# Patient Record
Sex: Female | Born: 1970 | Race: White | Hispanic: No | Smoking: Former smoker
Health system: Southern US, Community
[De-identification: ages and names within clinical notes are randomized; demographics above are authoritative.]

## PROBLEM LIST (undated history)

## (undated) DIAGNOSIS — F32A Depression, unspecified: Secondary | ICD-10-CM

## (undated) DIAGNOSIS — F419 Anxiety disorder, unspecified: Secondary | ICD-10-CM

## (undated) DIAGNOSIS — F329 Major depressive disorder, single episode, unspecified: Secondary | ICD-10-CM

## (undated) HISTORY — DX: Depression, unspecified: F32.A

## (undated) HISTORY — DX: Major depressive disorder, single episode, unspecified: F32.9

## (undated) HISTORY — DX: Anxiety disorder, unspecified: F41.9

## (undated) HISTORY — PX: LYMPHANGIOMA EXCISION: SHX1990

---

## 2002-01-01 ENCOUNTER — Emergency Department (HOSPITAL_COMMUNITY): Admission: EM | Admit: 2002-01-01 | Discharge: 2002-01-01 | Payer: Self-pay | Admitting: Emergency Medicine

## 2002-01-01 ENCOUNTER — Encounter: Payer: Self-pay | Admitting: Emergency Medicine

## 2002-11-10 ENCOUNTER — Other Ambulatory Visit: Admission: RE | Admit: 2002-11-10 | Discharge: 2002-11-10 | Payer: Self-pay | Admitting: Family Medicine

## 2002-11-12 ENCOUNTER — Encounter: Payer: Self-pay | Admitting: Family Medicine

## 2002-11-12 ENCOUNTER — Encounter: Admission: RE | Admit: 2002-11-12 | Discharge: 2002-11-12 | Payer: Self-pay | Admitting: Family Medicine

## 2003-08-07 HISTORY — PX: PARTIAL COLECTOMY: SHX5273

## 2003-12-06 ENCOUNTER — Ambulatory Visit (HOSPITAL_COMMUNITY): Admission: RE | Admit: 2003-12-06 | Discharge: 2003-12-06 | Payer: Self-pay | Admitting: Obstetrics and Gynecology

## 2003-12-13 ENCOUNTER — Other Ambulatory Visit: Admission: RE | Admit: 2003-12-13 | Discharge: 2003-12-13 | Payer: Self-pay | Admitting: Obstetrics and Gynecology

## 2004-01-15 ENCOUNTER — Inpatient Hospital Stay (HOSPITAL_COMMUNITY): Admission: AD | Admit: 2004-01-15 | Discharge: 2004-01-27 | Payer: Self-pay | Admitting: Obstetrics & Gynecology

## 2004-01-18 ENCOUNTER — Encounter (INDEPENDENT_AMBULATORY_CARE_PROVIDER_SITE_OTHER): Payer: Self-pay | Admitting: Specialist

## 2004-02-15 ENCOUNTER — Inpatient Hospital Stay (HOSPITAL_COMMUNITY): Admission: AD | Admit: 2004-02-15 | Discharge: 2004-02-20 | Payer: Self-pay | Admitting: Obstetrics and Gynecology

## 2004-02-15 ENCOUNTER — Encounter (INDEPENDENT_AMBULATORY_CARE_PROVIDER_SITE_OTHER): Payer: Self-pay | Admitting: *Deleted

## 2004-02-16 ENCOUNTER — Encounter: Payer: Self-pay | Admitting: Obstetrics & Gynecology

## 2004-08-12 IMAGING — CT CT ABDOMEN W/ CM
1 of 2 series · 15 of 32 positions shown, 19 images · IV contrast (omnipaque)
Comparison: none

CLINICAL DATA: 15 weeks pregnant.  Nausea and vomiting.  Mid-to-lower abdominal pain since 01/15/04.  Elevated while blood cell count.  Evaluate for appendicits or volvulus.  
CT ABDOMEN AND PELVIS WITH CONTRAST:
Images were obtained from the dome of the diaphragm through the symphysis pubis after intravenous and oral contrast.  100 ml of Omnipaque 300 was given intravenously.  
CT ABDOMEN:
The cecum is markedly dilated and is located in the epigastrium. It is 9.5 cm in maximal diameter.  There is intramural pneumatosis of the cecum and ascending colon.  There is a ?whirl? sign in the mid abdomen.  These findings are felt to be due to cecal volvulus with possible ischemic change of the cecum/ascending colon.  There is diffuse mild dilatation of small bowel loops.  No free intraperitoneal air is noted.
Liver, spleen, pancreas, adrenal glands and kidneys are normal.  Gallbladder has a normal CT appearance.  
IMPRESSION
Cecal volvulus with pneumatosis of the cecum and ascending colon suggesting the possibility of ischemic change.  No free intraperitoneal air noted.
CT PELVIS:
There is a large amount of gas and contrast in small bowel loops in the pelvis.  There is fecal material and gas in the rectum, sigmoid colon and descending colon.  The gravid uterus is visualized containing a single fetus.  No pelvic mass is identified.  
Distention of small bowel loops in the pelvis.  Gravid uterus.  No other abnormality.  
Results were telephoned directly to Dr. Naya Fc and Dr. Trystan Lobo.

[Series 2: — · axial · 0.63mm/px · z∈[-508,-68]mm · 15 of 105 slices shown, 19 images]
[im 5/105  soft-tissue]
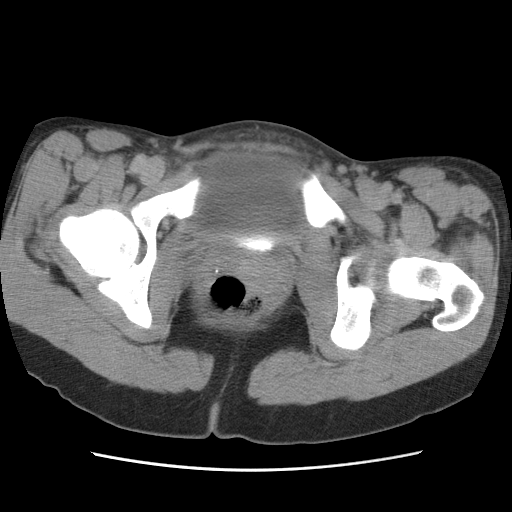
[im 5/105  bone]
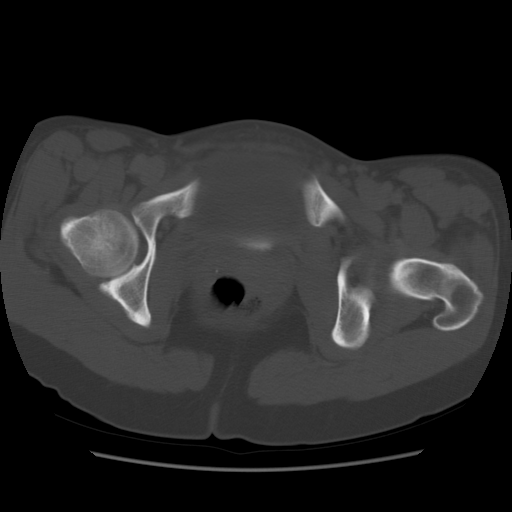
[im 13/105  soft-tissue]
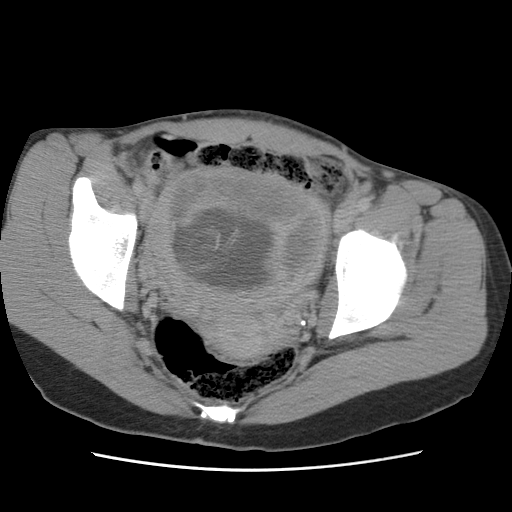
[im 21/105  soft-tissue]
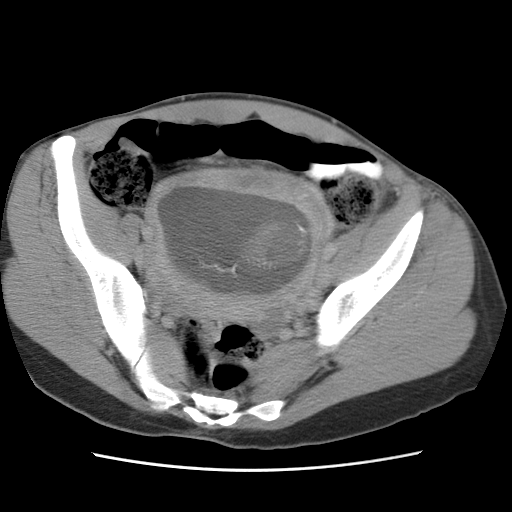
[im 29/105  soft-tissue]
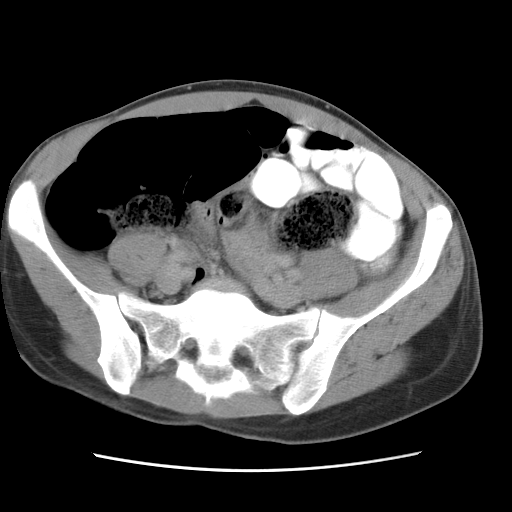
[im 37/105  soft-tissue]
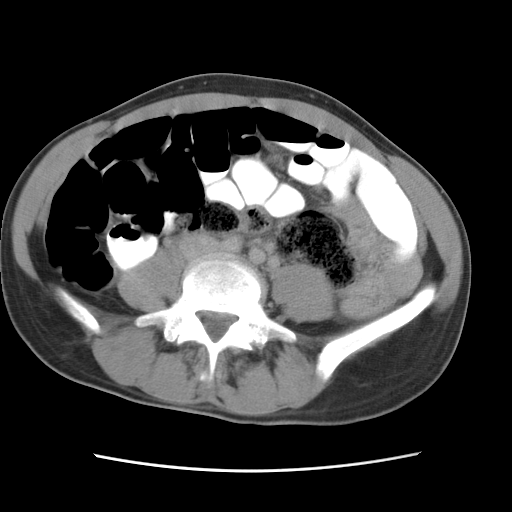
[im 45/105  soft-tissue]
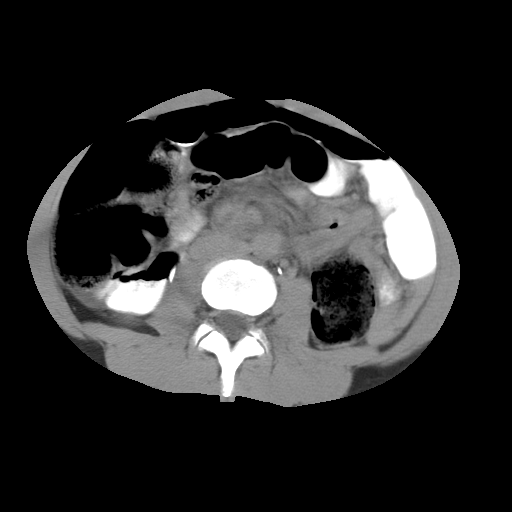
[im 53/105  soft-tissue]
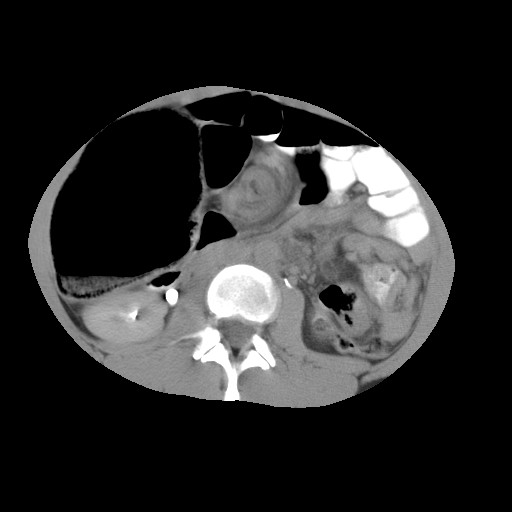
[im 61/105  soft-tissue]
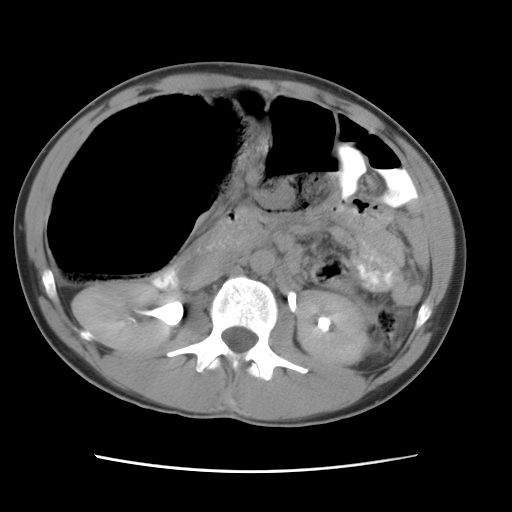
[im 69/105  soft-tissue]
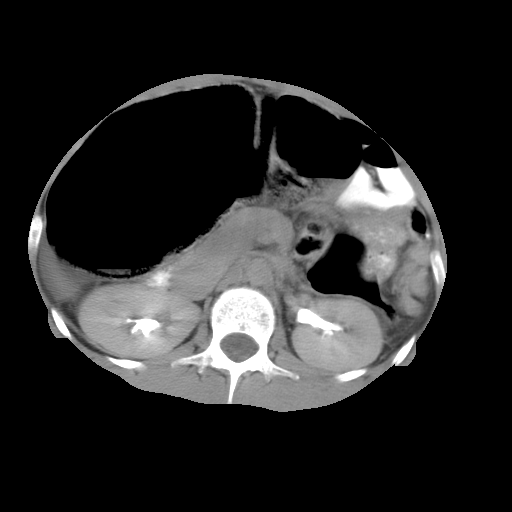
[im 69/105  bone]
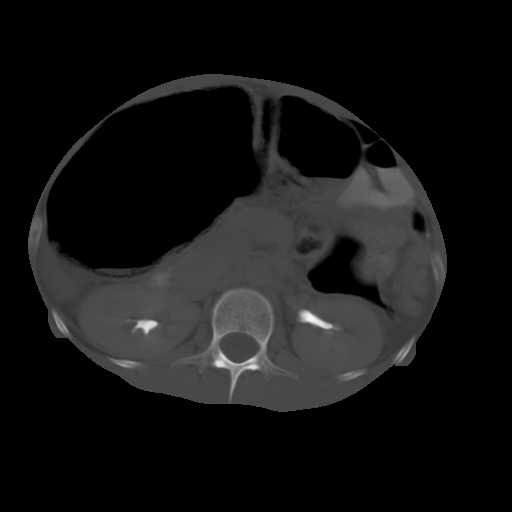
[im 77/105  soft-tissue]
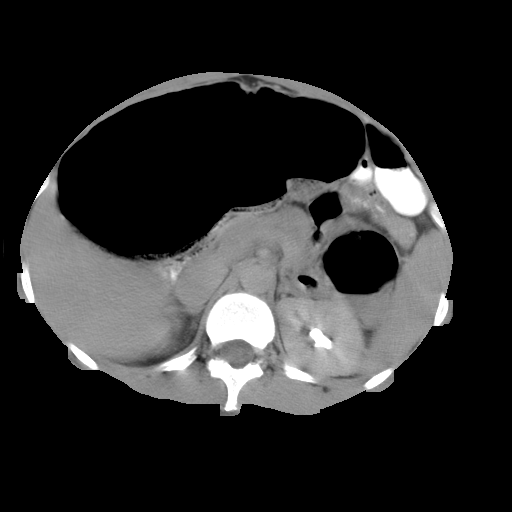
[im 85/105  soft-tissue]
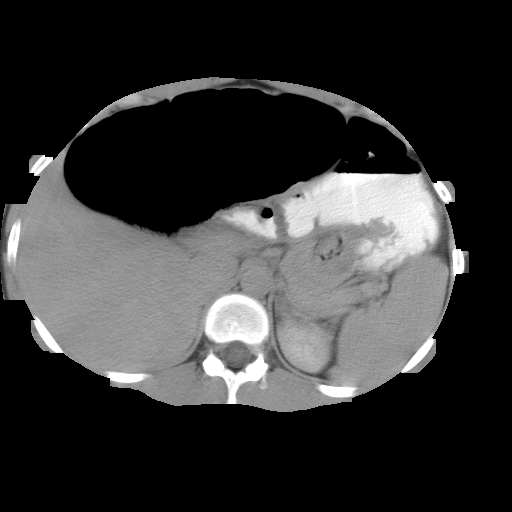
[im 89/105  lung]
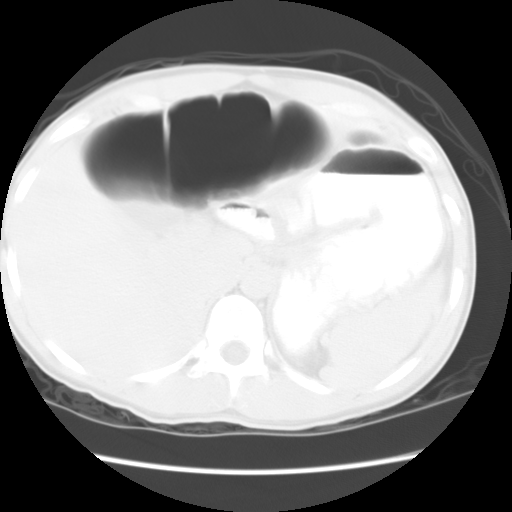
[im 93/105  soft-tissue]
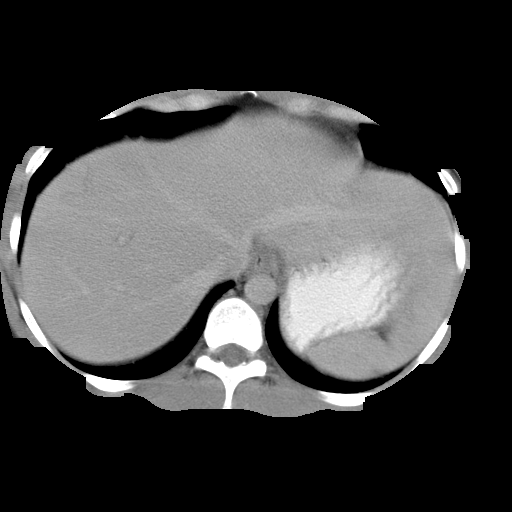
[im 93/105  lung]
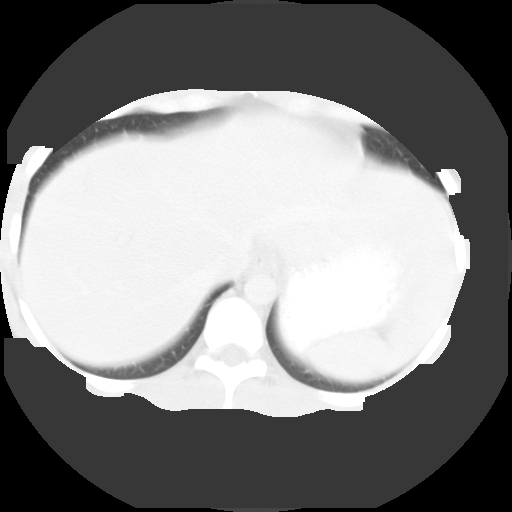
[im 97/105  lung]
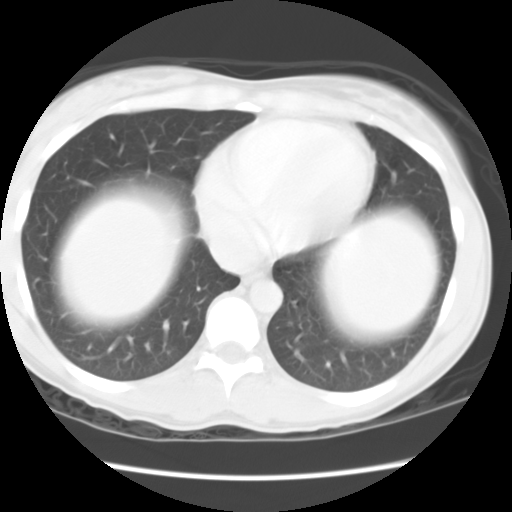
[im 101/105  soft-tissue]
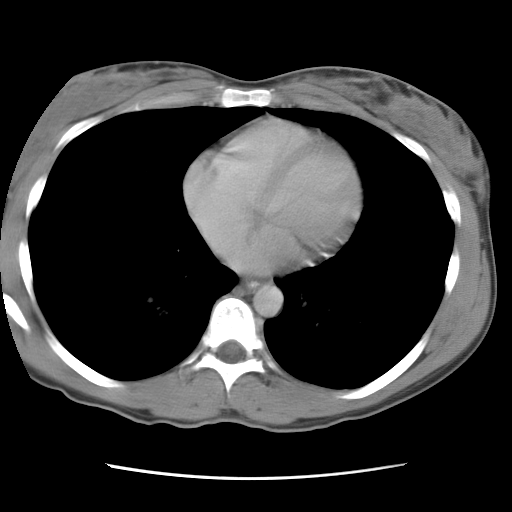
[im 101/105  lung]
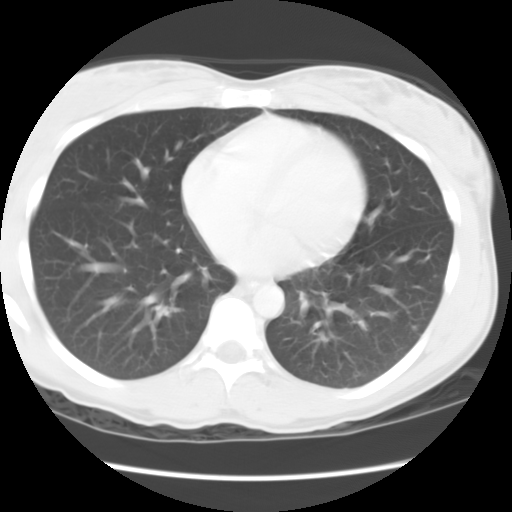

[15 of 32 positions shown; findings below may reference images not displayed]

## 2004-09-21 ENCOUNTER — Other Ambulatory Visit: Admission: RE | Admit: 2004-09-21 | Discharge: 2004-09-21 | Payer: Self-pay | Admitting: Family Medicine

## 2006-09-11 ENCOUNTER — Other Ambulatory Visit: Admission: RE | Admit: 2006-09-11 | Discharge: 2006-09-11 | Payer: Self-pay | Admitting: Family Medicine

## 2006-12-13 ENCOUNTER — Encounter: Admission: RE | Admit: 2006-12-13 | Discharge: 2006-12-13 | Payer: Self-pay | Admitting: Orthopedic Surgery

## 2007-11-28 ENCOUNTER — Encounter: Admission: RE | Admit: 2007-11-28 | Discharge: 2007-11-28 | Payer: Self-pay | Admitting: Family Medicine

## 2008-02-04 ENCOUNTER — Emergency Department (HOSPITAL_COMMUNITY): Admission: EM | Admit: 2008-02-04 | Discharge: 2008-02-04 | Payer: Self-pay | Admitting: Emergency Medicine

## 2009-03-07 ENCOUNTER — Other Ambulatory Visit: Admission: RE | Admit: 2009-03-07 | Discharge: 2009-03-07 | Payer: Self-pay | Admitting: Family Medicine

## 2010-08-26 ENCOUNTER — Encounter: Payer: Self-pay | Admitting: Family Medicine

## 2010-08-27 ENCOUNTER — Encounter: Payer: Self-pay | Admitting: Obstetrics & Gynecology

## 2010-10-12 ENCOUNTER — Encounter (INDEPENDENT_AMBULATORY_CARE_PROVIDER_SITE_OTHER): Payer: 59 | Admitting: Family Medicine

## 2010-10-12 DIAGNOSIS — F411 Generalized anxiety disorder: Secondary | ICD-10-CM

## 2010-11-06 ENCOUNTER — Other Ambulatory Visit (HOSPITAL_COMMUNITY)
Admission: RE | Admit: 2010-11-06 | Discharge: 2010-11-06 | Disposition: A | Discharge status: Home / Self Care | Payer: 59 | Source: Ambulatory Visit | Attending: Family Medicine | Admitting: Family Medicine

## 2010-11-06 ENCOUNTER — Encounter (INDEPENDENT_AMBULATORY_CARE_PROVIDER_SITE_OTHER): Payer: 59 | Admitting: Family Medicine

## 2010-11-06 ENCOUNTER — Other Ambulatory Visit: Payer: Self-pay | Admitting: Family Medicine

## 2010-11-06 DIAGNOSIS — Z124 Encounter for screening for malignant neoplasm of cervix: Secondary | ICD-10-CM | POA: Insufficient documentation

## 2010-11-06 DIAGNOSIS — Z0000 Encounter for general adult medical examination without abnormal findings: Secondary | ICD-10-CM

## 2010-11-06 DIAGNOSIS — Z79899 Other long term (current) drug therapy: Secondary | ICD-10-CM

## 2010-11-06 DIAGNOSIS — Z01419 Encounter for gynecological examination (general) (routine) without abnormal findings: Secondary | ICD-10-CM

## 2010-12-22 NOTE — Op Note (Signed)
NAME:  Courtney Reid, Courtney Reid                         ACCOUNT NO.:  [id number]   MEDICAL RECORD NO.:  [id number]                   PATIENT TYPE:  AMB   LOCATION:  DAY                                  FACILITY:  [facility]   PHYSICIAN:  Ollen Gross. Vernell Morgans, M.D.              DATE OF BIRTH:  21-Jan-1971   DATE OF PROCEDURE:  01/18/2004  DATE OF DISCHARGE:  01/18/2004                                 OPERATIVE REPORT   PREOPERATIVE DIAGNOSIS:  Cecal volvulus.   POSTOPERATIVE DIAGNOSIS:  Cecal volvulus with malrotation.   PROCEDURE:  1. Exploratory laparotomy.  2. Right hemicolectomy.   SURGEON:  Ollen Gross. Carolynne Edouard, M.D.   ASSISTANT:  Gita Kudo, M.D.   ANESTHESIA:  General endotracheal.   PROCEDURE:  After informed consent was obtained, the patient was brought to  the operating room and placed in the supine position on the operating room  table.  After adequate induction of general anesthesia, the patient's  abdomen was prepped with Betadine and draped in the usual sterile manner.  A  lower midline incision was made with the 10 blade knife from just above the  umbilicus to the pubic symphysis.  This incision was carried down through  the skin and subcutaneous tissue sharply with the electrocautery until linea  alba was identified.  The linea alba was also incised with electrocautery.  The preperitoneal space was prepared bluntly with the hemostat until the  peritoneum was opened and access was gained to the abdominal cavity.  The  rest of the incision was then opened under direct vision.  On initial  evaluation of the abdomen, the cecum appeared to be twisted and in the right  upper quadrant the cecum was able to be bluntly delivered into the incision.  The cecum was then gently untwisted.  The cecum appeared very large and  distended, with some areas of ischemia.  At this point the abdomen was  inspected.  The bowel had an unusual appearance to it, in that there were  quite a few  foreshortening, alternating with long length of mesentery that  was connected.  The filmy connections to the mesentery were bluntly  dissected with finger dissection.  At this point a site was chosen on the  terminal ileum that appeared to be healthy, for division of the bowel.  The  mesentery at this point was opened sharply with the electrocautery.  A GIA  __________ stapler was placed across the bowel at this point, clamped and  fired; by dividing the bowel between the staple lines.  Another site was  chosen along the transverse colon; looking at the blood supply to divide the  transverse colon.  The mesentery at this point was opened sharply with  electrocautery.  A GIA 75 stapler was placed across the bowel at this point;  clamped and fired thereby dividing the bowel between staple lines.  The  mesentery to the  segment of bowel was then taken down by serially clamping  with Kelly clamps, dividing and ligating with 2-0 silk ties the vessels  within the mesentery supplying this portion of the colon.  Once this was  completely accomplished, the right colon was removed with the appendix and  sent to pathology for further evaluation.  The bowel was then run in its  entirety, and it appeared as though the duodenum did not cross back over the  midline -- which would be consistent with a malrotation.   The small bowel was then run and placed in the right abdomen.  Care was  taken to check and make sure the mesentery to the terminal ileum was not  twisted.  At this point the terminal ileum was mobile enough to reach the  transverse colon without any difficulty.  The antimesenteric border of the  transverse colon was cleared of any filmy debris sharply with the  electrocautery.  The antimesenteric border of the transverse colon and  terminal ileum were then approximated using a 3-0 silk stitch.  The edge of  the staple on the antimesenteric border of the bowel was then excised  sharply with  curved Mayo's.    The appropriate limb of GIA stapler was then placed down each limb of the  bowel, in good approximation.  The GIA staple was clamped on the  antimesenteric border of the bowel; then fired.  Thereby creating a nice,  long widely patent enteroenteroscotomy  between the terminal ileum and  transverse colon.   The staple line was inspected from within the bowel; it appeared to be  hemostatic.  The enterotomy was then closed with interrupted 3-0 silk  stitches.  A single crotch stitch of interrupted 3-0 silk was also placed to  protect the staple line.  The mesenteric defect was closed with figure-of-  eight 3-0 silk stitches.   At this point, the colon was also inspected and the rest of the colon  appeared to be healthy.  The left colon had a lot of thickened stool in it.  The descending and lower descending and sigmoid colon also had a lot of  redundancy and floppiness to it, but, it was unprepped and at this point and  time with her being 15-weeks pregnant, we decided not to perform any further  bowel resection.  This portion of the bowel did appear to be healthy.   At this point the abdomen was irrigated with copious amounts of saline.  The  abdomen was examined again and found to be hemostatic.  The NG tube  positioning was checked and was in good position.  The fascia of the  anterior abdominal wall was then closed with a running #1 PDS, with two  running #1 PDS's.  The subcutaneous tissue was irrigated with copious  amounts of saline.  The skin was closed with staples.  Tolerated the  procedure well.  At the end of the case all needle, sponge and instrument  counts were correct.   The patient was then awakened and taken to the recovery room in stable  condition.                                               Ollen Gross. Vernell Morgans, M.D.    PST/MEDQ  D:  01/22/2004  T:  01/22/2004  Job:  [id number]

## 2010-12-22 NOTE — Discharge Summary (Signed)
NAME:  Courtney Reid, Courtney Reid                         ACCOUNT NO.:  [id number]   MEDICAL RECORD NO.:  [id number]                   PATIENT TYPE:  INP   LOCATION:  9316                                 FACILITY:  [facility]   PHYSICIAN:  Guy Sandifer. Arleta Creek, M.D.           DATE OF BIRTH:  May 10, 1971   DATE OF ADMISSION:  01/15/2004  DATE OF DISCHARGE:  01/27/2004                                 DISCHARGE SUMMARY   ADMITTING DIAGNOSES:  1. Intrauterine pregnancy at 15 weeks estimated gestational age.  2. Abdominal pain.   DISCHARGE DIAGNOSES:  Cecal volvulus.   REASON FOR ADMISSION:  This patient is a 40 year old married white female G3  P0 at approximately 15 weeks estimated gestational age who had had an  uncomplicated course of prenatal care until day of admission.  She had the  onset of colicky, severe upper abdominal pain radiating into the lower  abdomen.  She had had nausea but no other associated symptoms.  On  examination she was afebrile with stable vital signs.  Abdomen revealed  generalized abdominal tenderness particularly in the epigastrium.  Bowel  sounds were normal.  White count was 16.1; platelets 215,000; hemoglobin  13.3.  Urinalysis was normal.  Ultrasound showed a marked increased gas  pattern and was negative for cholelithiasis.   HOSPITAL COURSE:  The patient is admitted to the hospital for analgesia and  hydration and further evaluation.  On January 16, 2004 she is noted to have a  white count of 16.0 with a left shift.  She remains afebrile and still has  some pain from time to time although feels somewhat better.  On the next day  the pain is unchanged.  She is not passing any flatus and remains afebrile.  She has epigastric tenderness, much greater in the right than on the left,  and white count is 13.7.  She is given a Fleets enema, a repeat ultrasound,  and repeat laboratories.  Later that day her pain is unimproved and she has  not had flatus or a bowel movement after a  Fleets enema.  She now has nausea  after eating.  She remains afebrile with tenderness in the right upper  quadrant, epigastrium, and right lower quadrant.  There is no rebound  tenderness.  General surgery consultation is obtained.  CAT scan is obtained  on January 18, 2004.  It is consistent with cecal volvulus and pneumatosis of  the cecum and ascending colon.  She is then taken to the operating room on  January 18, 2004 by Dr. Carolynne Edouard.  Following this she remains afebrile.  Her O2  saturations are good.  She ambulates promptly.  Fetal heart tones remain in  the normal range.  NG tube is discontinued on June 17.  On June 20 she is  slightly distended with a few bowel sounds.  She is given a Dulcolax  suppository.  Her PCA is discontinued and  she is given Reglan IV.  By June  21 she is passing flatus and starting to feel much better.  Antibiotics are  discontinued on June 21.  She is discharged on June 23 ambulating well, with  good bowel function and good fetal heart tones.   CONDITION ON DISCHARGE:  Good.   DIET:  Regular as tolerated.   ACTIVITY:  No lifting, no operation of automobiles, no vaginal entry.   MEDICATIONS:  1. Protonix 40 mg #30 one p.o. daily with one refill.  2. Prenatal vitamins.   She is to call the office for problems including but not limited to  nausea/vomiting, increasing temperature or increasing pain, or vaginal  bleeding.  Follow-up is in the office in the next 1-2 weeks.                                               Guy Sandifer Arleta Creek, M.D.    JET/MEDQ  D:  02/10/2004  T:  02/11/2004  Job:  [id number]

## 2010-12-22 NOTE — H&P (Signed)
NAME:  Courtney Reid, Courtney Reid                         ACCOUNT NO.:  [id number]   MEDICAL RECORD NO.:  [id number]                   PATIENT TYPE:  INP   LOCATION:  9316                                 FACILITY:  [facility]   PHYSICIAN:  Freddy Finner, M.D.                DATE OF BIRTH:  [birth year]   DATE OF ADMISSION:  01/15/2004  DATE OF DISCHARGE:                                HISTORY & PHYSICAL   ADMISSION DIAGNOSES:  1. Intrauterine pregnancy, approximately [redacted] weeks gestation.  2. Abdominal pain, uncertain etiology.   HISTORY OF PRESENT ILLNESS:  The patient is a 40 year old white, married  female, gravida 3, para 0, whose prenatal course has been uncomplicated  until the day of admission.  At approximately 11 a.m. she described the  onset of a colicky severe upper abdominal pain which radiated into the lower  abdomen, particularly the left lower quadrant.  This has been associated  with nausea but no other associated symptoms. She denies urinary symptoms.  She denies diarrhea, she last had a bowel movement on the day prior to  admission.  She has not had fever.  No cardiopulmonary symptoms.  There are  not other GU symptoms.   PAST MEDICAL HISTORY:  No significant medical illnesses.   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  She had  1 therapeutic abortion. She has had 1 spontaneous abortion.   SOCIAL HISTORY:  She is not a cigarette smoker, does not use alcohol or  drugs.   MEDICATIONS:  She is currently on no medications other than prenatal  vitamins and a stool softener.   PHYSICAL EXAMINATION:  HEENT:  Grossly within normal limits.  NECK:  Thyroid gland is not palpably enlarged.  VITAL SIGNS:  Blood pressure is 123/68, temperature 97.9, pulse 77 and  regular, respirations 20.  GENERAL:  Well-developed, well-nourished white female who appears mildly  uncomfortable, lying on her left side.  She is alert and oriented and  cooperative.  CHEST:  Clear to  auscultation throughout.  HEART:  Normal sinus rhythm without murmurs, rubs, or gallops.  ABDOMEN:  Is gravid, appropriate fundal height for 15 weeks. Positive fetal  heart tone.  Generalized abdominal tenderness, particularly in the epigastrium.  There is  no percussion tenderness.  Auscultation reveals hyperactive bowel sounds  throughout, there is no CVA tenderness.  There is no appreciable abdominal  mass.  PELVIC EXAM:  Deferred.  EXTREMITIES:  Without clubbing, cyanosis or edema.   LABORATORY DATA:  A complete metabolic panel is normal, amylase and lipase  are normal.  WBC shows a white count of 16.1 thousand, platelets 215,000,  hemoglobin 13.3.  Urinalysis is normal.  Abdominal ultrasound shows a marked increased gas pattern in the bowel  obscuring examination of some of the solid organs of the upper abdomen.  No  apparent abnormality, no evidence of cholelithiasis.   ASSESSMENT:  1. Intrauterine pregnancy [redacted] weeks gestation.  2. Abdominal pain, uncertain etiology. Most likely a viral GI syndrome.   PLAN:  Admission for analgesia, hydration and for continued observation and  reevaluation.                                               Freddy Finner, M.D.    WRN/MEDQ  D:  01/15/2004  T:  01/16/2004  Job:  [id number]

## 2010-12-22 NOTE — Consult Note (Signed)
NAME:  Courtney Reid, Courtney Reid                         ACCOUNT NO.:  [id number]   MEDICAL RECORD NO.:  [id number]                   PATIENT TYPE:  OUT   LOCATION:  XRAY                                 FACILITY:  [facility]   PHYSICIAN:  Ollen Gross. Vernell Morgans, M.D.              DATE OF BIRTH:  January 07, 1971   DATE OF CONSULTATION:  DATE OF DISCHARGE:                                   CONSULTATION   HISTORY OF PRESENT ILLNESS:  Courtney Reid is a 40 year old white female well  known to me who presented about a month ago at 15-weeks' pregnant with a  cecal volvulus requiring a right hemicolectomy.  I saw her recently in the  clinic on Monday and at that time she looked great.  On Monday evening, she  developed some lower abdominal pain and low grade fever.  She had an  ultrasound done.  She was, at that time, 19-weeks' pregnant and ultrasound  was done on Tuesday that showed fetal demise.  She delivered the fetus in  the morning of July 13.  She has also had some problems with some low blood  pressure and continues to have some lower abdominal pain.  She has been  having normal bowel movements on a daily basis and has had three today.  She  has some vomiting yesterday.  This has since resolved.   PAST MEDICAL HISTORY:  1. Malrotation and cecal volvulus.  2. One spontaneous and one therapeutic abortion.   PAST SURGICAL HISTORY:  Right hemicolectomy about a month ago.   MEDICATIONS:  Prenatal vitamins.   ALLERGIES:  No known drug allergies.   SOCIAL HISTORY:  She denies the use of alcohol or tobacco products.   FAMILY HISTORY:  Noncontributory.   PHYSICAL EXAMINATION:  VITAL SIGNS:  On admission, her temperature was  100.6, blood pressure 120/80.  GENERAL:  She is an well developed, well nourished white female in no acute  distress.  SKIN:  Warm and dry.  No jaundice.  HEENT:  Eyes: Extraocular muscles are intact.  Pupils are equal, round, and  reactive to light.  Sclerae are nonicteric.  LUNGS:   Clear bilaterally with no use of accessory respiratory muscles.  HEART:  Regular rate and rhythm with an impulse in the left chest.  ABDOMEN:  Soft with very minimal upper abdominal tenderness, more tenderness  in the lower abdomen, no signs of peritonitis.  Her incision is healing  nicely.  She does have bowel sounds.  She is not distended.  EXTREMITIES:  No cyanosis, clubbing, or edema.  PSYCHOLOGIC:  She is alert and oriented x 3 with no evidence today of  anxiety or depression.   LABORATORY DATA:  On review of her labs, it was significant for a white  count 5400.  Her CT scan was also reviewed with the radiologist and showed a  small amount of fluid in her uterus but otherwise her abdominal  organs  looked good.  She had no evidence of obstruction or volvulus.  No evidence  of PE on chest CT.   ASSESSMENT/PLAN:  This is a 40 year old white female with fetal demise and  surgery about a month ago for cecal volvulus requiring right hemicolectomy  which she seems to have done well from.  It is not clear what the  source of her current pelvic pain is but it does not seem that she has any  recurrent evidence of obstruction or volvulus and her CT scan looks pretty  good.  I suspect that her pain is more GYN in nature but we will continue to  follow her closely with you and appreciate the opportunity to help in the  care of this patient.                                               Ollen Gross. Vernell Morgans, M.D.    PST/MEDQ  D:  02/16/2004  T:  02/17/2004  Job:  [id number]

## 2010-12-22 NOTE — Discharge Summary (Signed)
NAME:  Courtney Reid, Courtney Reid                         ACCOUNT NO.:  w   MEDICAL RECORD NO.:  [id number]                   PATIENT TYPE:  INP   LOCATION:  9373                                 FACILITY:  [facility]   PHYSICIAN:  Duke Salvia. Marcelle Overlie, M.D.            DATE OF BIRTH:  [birth year]   DATE OF ADMISSION:  02/15/2004  DATE OF DISCHARGE:  02/20/2004                                 DISCHARGE SUMMARY   DISCHARGE DIAGNOSES:  1. Reid 19-week intrauterine pregnancy.  2. Chorioamnionitis with intrauterine fetal demise.  3. 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   SUMMARY OF HISTORY AND PHYSICAL EXAMINATION:  Please see admission H&P for  details. Briefly, this is Reid 40 year old who one month ago underwent  laparotomy at 15 to [redacted] weeks gestation for Reid cecal volvulus with resection  of cecum, appendix, and portion of ileum. Her quad screen AFP returned Reid  trisomy-18 risk of 1/40, and she elected on the day before admission to have  amniocentesis. Clear fluid was removed at the time of amniocentesis without  immediate complication.   The following day she began experiencing some increased pressure, low-grade  fever, and was seen in triage where she was noted to have fever. Ultrasound  showed an IUFP presentation consistent with chorioamnionitis. She was  admitted at that point.   HOSPITAL COURSE:  After admission the patient was started on IV Unasyn, she  was given Cytotec and had fairly prompt progress in labor and delivered  early the next Reid.m. at 03:30, placenta and fetus intact.  Clindamycin was  added to Unasyn at that point since she was hypotensive with normal blood  loss despite fluid boluses. Dr. Carolynne Edouard, who performed her initial surgery, saw  her on February 16, 2004, after CT with contrast was ordered due to concerns  about some significant post delivery abdominal pain, chest discomfort along  with fever. At that point her WBC was 5400. CT showed no contrast leak and  no other abnormalities  except for small amount of fluid near the uterus. She  was started on IV Avelox 400 mg once daily at that point. WBC on February 17, 2004, was 26,000.  At that time also she complaining of some blisters along  her left lip, tongue that had the appearance of oral HSV. Oral Valtrex was  started at that point 2 gm q.12h. along with Miracle Mouthwash. On February 18, 2004, hemoglobin was 9.1, WBC 26,000, and she remained afebrile on IV  Avelox.   On February 19, 2004, the CBC showed WBC of 14.8, hemoglobin 11.0, platelet  count 181,000. She was seen by Dr. Jorja Loa to evaluate her oral HSV at that  point. He advised increasing the Valtrex to 1 gm t.i.d. HSV culture of her  lip had been done at that point also. By February 20, 2004, again she remained  afebrile. We had changed from Avelox to p.o. Augmentin in  case there was any  quinolone allergy/sensitivity to the Avelox. On the morning of February 20, 2004, she was feeling much improved, her tongue and lip soreness was better,  she was tolerating liquids at that point, her abdominal exam was  unremarkable, and she was requesting discharge at that point.   LABORATORY DATA:  CBC on admission revealed WBC of 5.4, hemoglobin 12.4. On  February 19, 2004, WBC 14.8, hemoglobin 11.0, platelet count 181,000. The wound  culture from February 16, 2004, from the placenta showed rare gram positive  rods, Reid few E. coli were noted sensitive to all antibiotics. The anaerobic  culture from the placenta showed no anaerobes, rare wbc's, rare gram-  positive rods. Torch titer showed rubella IgG positive, IgM negative, toxo  IgG and IgM were both negative. CMV IgG was positive. CMV IgM was negative.  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 The  placenta on fetal pathology report is still pending. The EKG reveals Reid  normal sinus rhythm. The CT report showed no acute abdominal findings,  status post right hemicolectomy. Fluid in the endometrial canal was noted.   No other abnormalities.   DISPOSITION:  1. Patient discharged on Augmentin 875 mg p.o. b.i.d. times seven.  2. Valtrex 1 gm p.o. t.i.d. times seven.  3. Demerol one p.o. q.4-6h. p.r.n. pain.  4. She will return to our office in two days for follow-up. We will arrange     for dermatology follow-up also. Advised her to report any worsening of     her oral lesions or difficulty swallowing, temperature over 101, any     increase in abdominal pain, persistent nausea and vomiting, and heavy     vaginal bleeding. She will return to work, light duty only, in ten days.   CONDITION ON DISCHARGE:  Good.   ACTIVITY:  Gradual increase.                                               Richard M. Marcelle Overlie, M.D.    RMH/MEDQ  D:  02/20/2004  T:  02/21/2004  Job:  [id number]   cc:   Ollen Gross. Vernell Morgans, M.D.  [address]  [place]  [place] [address]  Fax: [phone]   Ria Bush. Jorja Loa, M.D.  [address]  [place]  [place] [address]  Fax: [phone]

## 2010-12-22 NOTE — H&P (Signed)
NAME:  Courtney Reid, Courtney Reid                         ACCOUNT NO.:  [id number]   MEDICAL RECORD NO.:  [id number]                   PATIENT TYPE:  MAT   LOCATION:  [facility]C                                 FACILITY:  [facility]   PHYSICIAN:  Michelle L. Vincente Poli, M.D.            DATE OF BIRTH:  09-11-1970   DATE OF ADMISSION:  02/15/2004  DATE OF DISCHARGE:                                HISTORY & PHYSICAL   HISTORY OF PRESENT ILLNESS:  This patient is a 40 year old gravida 3, para 0-  0-2-0 at 19 weeks and 1 day. She presents after having an amniocentesis for  abnormal peak in the office yesterday. Complaining of lower abdominal pain  and a fever last night and today. She denies any vaginal bleeding or leakage  of fluid. Upon arrival to triage, immediate ultrasound at the bedside  revealed a fetal demise. 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 She was in the  hospital for some time due to that. She was just seen by the general surgeon  yesterday and cleared by him as well.   MEDICATIONS:  Include Darvocet last taken 4 to 5 days ago and prenatal  vitamins 1 tablet q. day.   ALLERGIES:  No known drug allergies.   OBSTETRICAL HISTORY:  As above. She has had 1 spontaneous AB and 1  therapeutic AB.   PAST SURGICAL HISTORY:  One month ago, removal of partial ileum and cecum,  appendix and 12 inches of her colon.  Dr. Carolynne Edouard was her general surgeon.   PHYSICAL EXAMINATION:  VITAL SIGNS:  On admission temperature 100.6, pulse  103, respiratory rate 20, blood pressure 126/81.  GENERAL:  The patient is in moderate discomfort, clutching her abdomen.  LUNGS:  Clear to auscultation bilaterally.  CARDIAC:  Regular rate and rhythm.  ABDOMEN:  Soft. She does have uterine tenderness. No rebound or guarding.  She has a healed vertical surgical incision. No vaginal bleeding  noted.   LABORATORY DATA:  White blood cell count 5.4, hematocrit 36.5. She has a  left shift with 94% neutrophils. Platelet count 164,000. PT and PTT within  normal limits.   IMPRESSION:  Intrauterine fetal demise status post amniocentesis and  endometritis.   PLAN:  Admit to AICU. Will start antibiotics with Unasyn 3 grams  intravenously q. 6. Cytotec 200 mcg per vaginal q. 12 hours. Pain medication  as needed. Will send TORCH titers. Recommendation after delivery is placenta  cultures. Will send cultures at that time and recommend autopsy. Findings  and plan of care discussed with the patient as well as her spouse. All  questions are answered.  Michelle L. Vincente Poli, M.D.   Florestine Avers  D:  02/15/2004  T:  02/15/2004  Job:  [id number]

## 2010-12-22 NOTE — Consult Note (Signed)
NAME:  Courtney Reid, Courtney Reid                         ACCOUNT NO.:  [id number]   MEDICAL RECORD NO.:  [id number]                   PATIENT TYPE:  INP   LOCATION:  9316                                 [facility]ACILITY:  [facility]   PHYSICIAN:  Ollen Gross. Vernell Morgans, M.D.              DATE OF BIRTH:  02/01/1971   DATE OF CONSULTATION:  01/17/2004  DATE OF DISCHARGE:                                   CONSULTATION   HISTORY OF PRESENT ILLNESS:  Ms. Saccente is a 40 year old white female who  is [redacted] weeks pregnant who presented 2 days ago with abdominal pain.  The  patient pain was associated with some nausea and vomiting, although she has  not vomited in the last 24 hours now. The patient pain she describes as  crampy in nature and is located in both the epigastric and left and right  lower quadrants.  She has not run any fevers.  Initially on presentation her  white count was around 16,000, today it was 13,700 and she has not been on  any antibiotics and she has been eating.  Her pain has not changed much over  the last couple of days.  She otherwise denies nausea, vomiting, fevers,  chills, chest pain, shortness of breath, diarrhea, dysuria.  The rest of her  review of systems is unremarkable.   PAST MEDICAL HISTORY:  Significant for a 15 week intrauterine pregnancy.   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   ALLERGIES:  No known drug allergies.   MEDICATIONS AT HOME:  Prenatal vitamins and stool softeners.   SOCIAL HISTORY:  She denies the use of alcohol or tobacco products.   FAMILY HISTORY:  Noncontributory.   PHYSICAL EXAMINATION:  Temperature is 98.9, blood pressure 103/56, pulse of  64.  GENERAL:  She is a well-nourished, well-developed white female in no acute  distress.  Her skin is warm and dry with no jaundice.  EYES:  Extraocular muscles are intact, equal, round and reactive to light.  Sclerae are  nonicteric.  LUNGS:  Clear bilaterally with no use of accessory muscles.  HEART:  Regular rate and rhythm with an impulse on left chest.  ABDOMEN:  Sort of diffusely tender with some occasional guarding but no  obvious peritonitis.  She is slightly distended.  No palpable mass or  hepatosplenomegaly.  EXTREMITIES:  No cyanosis, clubbing or edema.  PSYCHOLOGICAL:  She is alert and oriented x3 with no state of anxiety or  depression.   ADMISSION LABORATORIES:  On review of her lab work, her white count today  was 13,700, hemoglobin 11.8, hematocrit 34.8, platelet count 191.  On review  of her ultrasound, her gallbladder appeared to be normal and her liver  functions were normal.   ASSESSMENT AND PLAN:  This is a 40 year old 15-week pregnant white female  with sort of diffuse  abdominal pain for the last couple of days.  She has  not run any fever and while she has been admitted to the hospital her white  count has been decreasing while not on antibiotics.  Her pain is pretty much  unchanged during this period of time.  This patient is certainly worrisome  for a appendicitis although it is unusual that she has not run any fever and  her white count has been coming down without any antibiotic therapy.  It  seems as though the options for her include either #1, a CT scan of her  abdomen and pelvis to look for possible appendicitis or other source of her  pain.  Unfortunately, organogenesis continues until about 17 weeks so this  is not the best time for her to have a CT scan.  The second option would be  antibiotic therapy and close observation, and the third option would be  exploratory laparoscopy in the operating room.  Certainly, all of these  options carry some risk both to her as well as to her fetus and I spent  significant amount of time discussing all of these options including the  risks and benefits with her tonight, as well as with her husband.  They  understand the dilemma and  would like to pursue antibiotics and close  observation.  I think this is a reasonable choice.  Certainly if she does  not improve over the next 24 or 48 hours, then we may need to take her to  the operating room and look in her abdomen for the source of her pain, and  otherwise if she improves we will continue to follow closely.  We will plan  to start her on Zosyn and repeat her CBC in the morning.                                               Ollen Gross. Vernell Morgans, M.D.    PST/MEDQ  D:  01/17/2004  T:  01/17/2004  Job:  [id number]

## 2010-12-29 ENCOUNTER — Ambulatory Visit (INDEPENDENT_AMBULATORY_CARE_PROVIDER_SITE_OTHER): Payer: 59 | Admitting: Family Medicine

## 2010-12-29 ENCOUNTER — Encounter: Payer: Self-pay | Admitting: Family Medicine

## 2010-12-29 VITALS — BP 120/70 | HR 72 | Temp 98.4°F | Ht 70.0 in | Wt 145.0 lb

## 2010-12-29 DIAGNOSIS — N390 Urinary tract infection, site not specified: Secondary | ICD-10-CM

## 2010-12-29 DIAGNOSIS — F411 Generalized anxiety disorder: Secondary | ICD-10-CM | POA: Insufficient documentation

## 2010-12-29 LAB — POCT UA - MICROSCOPIC ONLY

## 2010-12-29 MED ORDER — CIPROFLOXACIN HCL 250 MG PO TABS: 250.0000 mg | ORAL_TABLET | Freq: Two times a day (BID) | ORAL | Status: AC

## 2010-12-29 NOTE — Patient Instructions (Signed)
Urinary Tract Infection (UTI) Infections of the urinary tract can start in several places. A bladder infection (cystitis), a kidney infection (pyelonephritis), and a prostate infection (prostatitis) are different types of urinary tract infections. They usually get better if treated with medicines (antibiotics) that kill germs. Take all the medicine until it is gone. You or your child may feel better in a few days, but TAKE ALL MEDICINE or the infection may not respond and may become more difficult to treat. HOME CARE INSTRUCTIONS  Drink enough water and fluids to keep the urine clear or pale yellow. Cranberry juice is especially recommended, in addition to large amounts of water.   Avoid caffeine, tea, and carbonated beverages. They tend to irritate the bladder.   Alcohol may irritate the prostate.   Only take over-the-counter or prescription medicines for pain, discomfort, or fever as directed by your caregiver.  FINDING OUT THE RESULTS OF YOUR TEST Not all test results are available during your visit. If your or your child's test results are not back during the visit, make an appointment with your caregiver to find out the results. Do not assume everything is normal if you have not heard from your caregiver or the medical facility. It is important for you to follow up on all test results. TO PREVENT FURTHER INFECTIONS:  Empty the bladder often. Avoid holding urine for long periods of time.   After a bowel movement, women should cleanse from front to back. Use each tissue only once.   Empty the bladder before and after sexual intercourse.  SEEK MEDICAL CARE IF:  There is back pain.   You or your child has an oral temperature above 101.   Your baby is older than 3 months with a rectal temperature of 100.5 F (38.1 C) or higher for more than 1 day.   Your or your child's problems (symptoms) are no better in 3 days. Return sooner if you or your child is getting worse.  SEEK IMMEDIATE  MEDICAL CARE IF:  There is severe back pain or lower abdominal pain.   You or your child develops chills.   You or your child has an oral temperature above 101, not controlled by medicine.   Your baby is older than 3 months with a rectal temperature of 101 F (38.9 C) or higher.   Your baby is 17 months old or younger with a rectal temperature of 100.4 F (38 C) or higher.   There is nausea or vomiting.   There is continued burning or discomfort with urination.  MAKE SURE YOU:  Understand these instructions.   Will watch this condition.   Will get help right away if you or your child is not doing well or gets worse.  Document Released: 05/02/2005 Document Re-Released: 10/17/2009 [facility] Patient Information 2011 [place], [place].

## 2010-12-29 NOTE — Progress Notes (Signed)
  Subjective:    Patient ID: Courtney Reid, female    DOB: [birth year], 40 y.o.   MRN: [id number]  HPI Dysuria began 4 days ago, with worsening of urgency and frequency since then.  No hematuria.  Denies fevers, chills, nausea, vomiting or flank pain.  Started using Azo since Wednesday evening with some improvement in the burning (temporarily).  LMP was last week, denies possibility of pregnancy.  Denies significant vaginal discharge.  Going out of town for a week  Past Medical History  Diagnosis Date  . Anxiety panic attacks  . Depression     Past Surgical History  Procedure Date  . Partial colectomy 2005  . 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 Marital Status: Legally Separated    Spouse Name: N/A    Number of Children: N/A  . Years of Education: N/A   Occupational History  . Not on file.   Social History Main Topics  . Smoking status: Former Smoker    Types: Cigarettes    Quit date: 08/06/1996  . Smokeless tobacco: Not on file  . Alcohol Use: Yes     2-3 glasses of wine per day  . Drug Use: No  . Sexually Active: Yes    Birth Control/ Protection: Condom   Other Topics Concern  . Not on file   Social History Narrative  . No narrative on file    Family History  Problem Relation Age of Onset  . Hypothyroidism Mother   . Hyperlipidemia Mother     Current outpatient prescriptions:DULoxetine (CYMBALTA) 60 MG capsule, Take 60 mg by mouth 2 (two) times daily.  , Disp: , Rfl: ;  ALPRAZolam (XANAX) 0.5 MG tablet, Take 0.5 mg by mouth as needed.  , Disp: , Rfl: ;  ciprofloxacin (CIPRO) 250 MG tablet, Take 1 tablet (250 mg total) by mouth 2 (two) times daily., Disp: 14 tablet, Rfl: 0  No Known Allergies  Review of Systems See above.  No URI symptoms, cough, SOB, rash.  Some mild chronic body aches related to exercise    Objective:   Physical Exam Well developed, pleasant female in no distress BP 120/70  Pulse 72   Temp(Src) 98.4 F (36.9 C) (Oral)  Ht 5\' 10"  (1.778 m)  Wt 145 lb (65.772 kg)  BMI 20.81 kg/m2  LMP 12/15/2010 Back:  No CVA tenderness Abdomen:  Mild suprapubic tenderness Extremities:  No edema       Assessment & Plan:   1. Urinary tract infection, site not specified  ciprofloxacin (CIPRO) 250 MG tablet, POCT UA - Microscopic Only   Patient was instructed to call if symptoms do not improve over the next 48 hours.  Urine dip not performed due to orange appearance from the AZO she took, but microscopic exam was consistent with UTI.  Not sent for urine culture (no h/o frequent UTI's)

## 2011-04-27 ENCOUNTER — Ambulatory Visit (INDEPENDENT_AMBULATORY_CARE_PROVIDER_SITE_OTHER): Payer: BC Managed Care – PPO | Admitting: Family Medicine

## 2011-04-27 ENCOUNTER — Encounter: Payer: Self-pay | Admitting: Family Medicine

## 2011-04-27 VITALS — BP 120/88 | HR 64 | Temp 98.4°F | Ht 69.0 in | Wt 141.0 lb

## 2011-04-27 DIAGNOSIS — N390 Urinary tract infection, site not specified: Secondary | ICD-10-CM

## 2011-04-27 LAB — POCT URINALYSIS DIPSTICK
Blood, UA: NEGATIVE
Spec Grav, UA: 1.01

## 2011-04-27 NOTE — Progress Notes (Signed)
2 weeks ago started having dysuria, cloudy urine, urinary frequency.  Was seen in [facility] (9/11)and treated with Nitrofurantoin (and pyridium).  Completed 1 week of ABX, and urinary symptoms resolved.  Had recurrent urinary symptoms 2 days ago, the day after intercourse.  Took an Azo on Wed.  Drank a lot of cranberry juice yesterday.  Burning isn't as bad, but having ongoing frequency. Has had UTI symptoms develop after each of the 2 times she has had unprotected intercourse with new partner.  Past Medical History  Diagnosis Date  . Anxiety panic attacks  . Depression     Past Surgical History  Procedure Date  . 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 Lymphangioma excision [id number])    tongue    History   Social History  . Marital Status: Legally Separated    Spouse Name: N/A    Number of Children: 0  . Years of Education: N/A   Occupational History  . Not on file.   Social History Main Topics  . Smoking status: Former Smoker    Types: Cigarettes    Quit date: 08/06/1996  . Smokeless tobacco: Not on file  . Alcohol Use: Yes     2-3 glasses of wine per day  . Drug Use: No  . Sexually Active: Yes -- Female partner(s)    Birth Control/ Protection: Condom   Other Topics Concern  . Not on file   Social History Narrative   Former IT sales professional; Systems analyst.  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 Hypothyroidism Mother   . Hyperlipidemia Mother     Current outpatient prescriptions:ALPRAZolam (XANAX) 0.5 MG tablet, Take 0.5 mg by mouth as needed.  , Disp: , Rfl: ;  DULoxetine (CYMBALTA) 60 MG capsule, Take 60 mg by mouth 2 (two) times daily.  , Disp: , Rfl:   No Known Allergies  ROS: Denies fevers.  Having some back pain related to an injury.  Denies nausea, vomiting, rash, URI symptoms or other concerns  PHYSICAL EXAM: BP 120/88  Pulse 64  Temp(Src) 98.4 F (36.9 C) (Oral)   Ht 5\' 9"  (1.753 m)  Wt 141 lb (63.957 kg)  BMI 20.82 kg/m2  LMP 04/04/2011 Pleasant, well developed female in no distress.  Went to eye doctor prior to appointment and pupils were still dilated Back: mild tenderness at R CVA, but very superficial, and extending to paraspinal area (not flank).  Abdomen; nontender  ASSESSMENT/PLAN: 1. Urinary tract infection  POCT Urinalysis Dipstick, Urine Culture   U/A today is normal.  Send for culture.  Discussed DDx--urethral irritation, possibility of STD (partner tested today).  Continue to void after intercourse. Recommended condom use.  If recurrent infection, consider prophylactic ABX to be used with intercourse.  Will advise of culture results when available.

## 2011-05-01 ENCOUNTER — Telehealth: Payer: Self-pay | Admitting: *Deleted

## 2011-05-01 LAB — URINE CULTURE: Colony Count: 50000

## 2011-05-01 NOTE — Telephone Encounter (Signed)
rx called in

## 2011-05-01 NOTE — Progress Notes (Signed)
Called in Macrobid 100mg  #14 BID x 7days 0 refill to [facility] [place] [phone].

## 2011-05-03 LAB — DIFFERENTIAL
Basophils Absolute: 0
Basophils Relative: 0
Eosinophils Relative: 1
Lymphocytes Relative: 29
Lymphs Abs: 2
Monocytes Absolute: 0.6

## 2011-05-03 LAB — POCT CARDIAC MARKERS
CKMB, poc: 1.1
Myoglobin, poc: 28.6
Myoglobin, poc: 30.2
Operator id: 272551
Operator id: 272551
Troponin i, poc: <0.05

## 2011-05-03 LAB — CBC
HCT: 36.4
MCHC: 34.5
MCV: 96.5
Platelets: 205
WBC: 7

## 2011-05-03 LAB — POCT I-STAT, CHEM 8
HCT: 38
Sodium: 139

## 2011-08-07 DIAGNOSIS — K645 Perianal venous thrombosis: Secondary | ICD-10-CM

## 2011-08-07 HISTORY — DX: Perianal venous thrombosis: K64.5

## 2011-08-22 ENCOUNTER — Telehealth: Payer: Self-pay | Admitting: Family Medicine

## 2011-08-22 NOTE — Telephone Encounter (Signed)
Called pt's boyfriend, Melanee Spry back to obtain more information about his earlier call so that I can send a note back to Dr.Knapp. Left message for him to return my call.
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
## 2011-08-22 NOTE — Telephone Encounter (Signed)
Spoke with Melanee Spry.
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
## 2012-01-07 ENCOUNTER — Ambulatory Visit (INDEPENDENT_AMBULATORY_CARE_PROVIDER_SITE_OTHER): Payer: BC Managed Care – PPO | Admitting: Family Medicine

## 2012-01-07 ENCOUNTER — Encounter: Payer: Self-pay | Admitting: Family Medicine

## 2012-01-07 VITALS — BP 100/70 | HR 64 | Temp 98.0°F | Ht 70.0 in | Wt 151.0 lb

## 2012-01-07 DIAGNOSIS — R5383 Other fatigue: Secondary | ICD-10-CM

## 2012-01-07 DIAGNOSIS — R5381 Other malaise: Secondary | ICD-10-CM

## 2012-01-07 DIAGNOSIS — N6019 Diffuse cystic mastopathy of unspecified breast: Secondary | ICD-10-CM

## 2012-01-07 DIAGNOSIS — F411 Generalized anxiety disorder: Secondary | ICD-10-CM

## 2012-01-07 MED ORDER — DULOXETINE HCL 60 MG PO CPEP: 60.0000 mg | ORAL_CAPSULE | Freq: Two times a day (BID) | ORAL | Status: DC

## 2012-01-07 NOTE — Patient Instructions (Addendum)
Schedule mammogram at breast center  Use antibacterial ointment and cover with bandage to allow back lesion to completely heal.  If it doesn't heal, then we may need to biopsy it.
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
## 2012-01-08 ENCOUNTER — Encounter: Payer: Self-pay | Admitting: Family Medicine

## 2012-01-08 LAB — COMPREHENSIVE METABOLIC PANEL
Alkaline Phosphatase: 76 U/L (ref 39–117)
BUN: 19 mg/dL (ref 6–23)
CO2: 26 mEq/L (ref 19–32)
Calcium: 9.1 mg/dL (ref 8.4–10.5)
Potassium: 4.4 mEq/L (ref 3.5–5.3)
Sodium: 139 mEq/L (ref 135–145)
Total Bilirubin: 0.4 mg/dL (ref 0.3–1.2)

## 2012-01-08 LAB — CBC WITH DIFFERENTIAL/PLATELET
Eosinophils Absolute: 0.1 10*3/uL (ref 0.0–0.7)
Eosinophils Relative: 1 % (ref 0–5)
Lymphocytes Relative: 32 % (ref 12–46)
Lymphs Abs: 2.5 10*3/uL (ref 0.7–4.0)
Monocytes Absolute: 0.8 10*3/uL (ref 0.1–1.0)
Neutro Abs: 4.4 10*3/uL (ref 1.7–7.7)
Neutrophils Relative %: 56 % (ref 43–77)
RDW: 13.5 % (ref 11.5–15.5)
WBC: 7.8 10*3/uL (ref 4.0–10.5)

## 2012-01-08 LAB — VITAMIN D 25 HYDROXY (VIT D DEFICIENCY, FRACTURES): Vit D, 25-Hydroxy: 45 ng/mL (ref 30–89)

## 2012-01-17 ENCOUNTER — Other Ambulatory Visit: Payer: Self-pay | Admitting: Family Medicine

## 2012-01-17 DIAGNOSIS — Z1231 Encounter for screening mammogram for malignant neoplasm of breast: Secondary | ICD-10-CM

## 2012-01-29 ENCOUNTER — Ambulatory Visit
Admission: RE | Admit: 2012-01-29 | Discharge: 2012-01-29 | Disposition: A | Discharge status: Home / Self Care | Payer: BC Managed Care – PPO | Source: Ambulatory Visit | Attending: Family Medicine | Admitting: Family Medicine

## 2012-01-29 DIAGNOSIS — Z1231 Encounter for screening mammogram for malignant neoplasm of breast: Secondary | ICD-10-CM

## 2012-02-19 ENCOUNTER — Ambulatory Visit (INDEPENDENT_AMBULATORY_CARE_PROVIDER_SITE_OTHER): Payer: BC Managed Care – PPO | Admitting: Sports Medicine

## 2012-02-19 VITALS — BP 116/73 | Ht 70.0 in | Wt 150.0 lb

## 2012-02-19 DIAGNOSIS — M25529 Pain in unspecified elbow: Secondary | ICD-10-CM | POA: Insufficient documentation

## 2012-02-19 DIAGNOSIS — M7660 Achilles tendinitis, unspecified leg: Secondary | ICD-10-CM

## 2012-02-19 DIAGNOSIS — M7710 Lateral epicondylitis, unspecified elbow: Secondary | ICD-10-CM

## 2012-02-19 HISTORY — DX: Lateral epicondylitis, unspecified elbow: M77.10

## 2012-02-19 HISTORY — DX: Achilles tendinitis, unspecified leg: M76.60

## 2012-02-19 MED ORDER — NITROGLYCERIN 0.2 MG/HR TD PT24: MEDICATED_PATCH | TRANSDERMAL | Status: DC

## 2012-02-19 NOTE — Assessment & Plan Note (Signed)
Likely lateral extensor tendon injury initially, now with some medial epicondyle pain too.

## 2012-02-19 NOTE — Assessment & Plan Note (Signed)
With partial tear of extensor tendons.  Will start nitro protocol, as well as HEP (see pt instructions).  Advised to avoid activities that cause pain.  Follow up in 4-6 weeks.

## 2012-02-19 NOTE — Patient Instructions (Signed)
Please use 1/4 patch of nitro on your elbow, change every 24 hours.  If you tolerate this, after a week or two, you can try it on your ankle too.    Please wear the elbow sleeve during exercise and one hour after, please wear a heel lift in your left shoe.    Please do the elbow exercises- reverse wrist curls, wrist curls, and wrist turnovers Also do the toe walks, heel walks, and heel lifts.    Please come back and see [place] in 4-6 weeks.

## 2012-02-19 NOTE — Assessment & Plan Note (Addendum)
No tear visualized, but there is nodular swelling.  Will start her in heel lifts on left side, and achilles HEP- see pt instructions.  If she tolerates nitro on elbow, will have her try it on achilles.  Again advised avoiding aggravating activities.

## 2012-02-19 NOTE — Progress Notes (Signed)
  Subjective:    Patient ID: Courtney Reid, female    DOB: 1971/07/13, 41 y.o.   MRN: [id number]  HPI  Courtney Reid comes in with left elbow pain and left posterior ankle pain  L Elbow pain- since February, started lateral epicondyle, she thinks initial injury was doing pull ups.  Hurts with clean and press, pull ups, opening doors.  She has rested her elbow worked with a PT, iced it, tried heat, and had two dry needling treatments.  She has had some improvement of the pain with these therapies, but the pain is still significant.  She has also started to have pain on the medial epicondyle, which she thinks is from compensating for the lateral side hurting.   She has worked with Thereasa Distance, DC who suggested she see me.  LT Ankle pain- over achilles, ran half marathon without training last spring, has been hurting since then.  Hurts with running, any jumping exercises.    Review of Systems See HPI     Objective:   Physical Exam BP 116/73  Ht 5\' 10"  (1.778 m)  Wt 150 lb (68.04 kg)  BMI 21.52 kg/m2  LMP 01/01/2012 General appearance: alert, cooperative and no distress Left Elbow: No deformity +TTP over lateral>medial epicondyles Pain with extension, particular 4th digit extension.   Minimal pain with flexion.   Left Ankle:  No swelling, no deformity No TTP over malleoli  Achiles is thickened and tender about 3 cm above insertion No tenderness at achilles insertion Strength and ROM in tact.   MSK [place] L Elbow:  Hypoechoic changes consistent with split at attachment of extensors at lateral epicondyle.  Increased doppler flow seen at split.  Bone spur and some edema at medial epicondyle.   L Achilles: Long view shows in tact tendon that measures .8cm at avascular area, with surrounding hypoechoic changes.  Transverse view shows thickened tendon with some hypoechoic areas within. No increased doppler flow.      Assessment & Plan:

## 2012-02-22 ENCOUNTER — Encounter: Payer: Self-pay | Admitting: *Deleted

## 2012-02-22 MED ORDER — DICLOFENAC SODIUM 1 % TD GEL: TRANSDERMAL | Status: DC

## 2012-02-22 NOTE — Progress Notes (Signed)
Patient ID: Courtney Reid, female   DOB: [birth year], 41 y.o.   MRN: [id number] Pt called with SI from the ntg patch and states she will not continue to use patch with the HA and other sxs prevalent. Per neal, we can call in voltaren gel and aspercreme otc if insurance does cover the gel.

## 2012-03-25 ENCOUNTER — Ambulatory Visit (INDEPENDENT_AMBULATORY_CARE_PROVIDER_SITE_OTHER): Payer: BC Managed Care – PPO | Admitting: Sports Medicine

## 2012-03-25 VITALS — BP 110/70 | Ht 70.0 in | Wt 155.0 lb

## 2012-03-25 DIAGNOSIS — M7710 Lateral epicondylitis, unspecified elbow: Secondary | ICD-10-CM

## 2012-03-25 DIAGNOSIS — M7660 Achilles tendinitis, unspecified leg: Secondary | ICD-10-CM

## 2012-03-25 NOTE — Assessment & Plan Note (Signed)
This has improved some with HEP already  Keep this up  Use heel lifts

## 2012-03-25 NOTE — Assessment & Plan Note (Signed)
Tx with rehab and topicals as she had migraine sxs induced by NTG  rescan in 2 to 3 mos or when 80% better before adding more weight stress

## 2012-03-25 NOTE — Progress Notes (Signed)
  Subjective:    Patient ID: Courtney Reid, female    DOB: Jun 21, 1971, 41 y.o.   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  Continues to have pain in lateral aspect of elbow but is mildly improved, 3/10 in severity.  Pain is exacerbated by squat cleans, handstand pushups, and pull ups.  She is wearing the body helix counterforce strap and also using voltaren gel on the area 3-4x daily.  She attempted a trial of nitroglycerin but had intolerable side effects including headaches and vomiting.  She has also now had 2 weeks of medial elbow pain, which she believes has developed secondary to compensating for her lateral epicondylitis.  She does wear wrist wraps when working out.     Review of Systems Negative for neck pain, shoulder pain.  No extremity numbness, tingling, loss of sensation.      Objective:   Physical Exam Gen:  Well appearing, well nourished, nad Psych:  Appropriate mood and affect HEENT:  Ncat, mmm, EOMI Resp:  Respirations non-labored. Left elbow:  No gross deformity, no edema or ecchymosis.  TTP over lateral epicondyle and insertion of common extensor tendon.  TTP over medial epicondyle.  Full ROM at elbow.  Pain with resisted wrist extension and resisted wrist flexion.   Left ankle:  ttp over achiles tendon approximately 2 cm above insertion site, negative Thompson's test.Thickened area above insertion  MS Ultrasound: L Elbow:  Improving split at attachment of extensors at lateral epicondyle, neovascularization present. Medial epicondyle with bone spur and minimal edema.     Assessment & Plan:  41 yof presents for follow up left elbow pain and achilles tendonitis.  1.  Lateral epicondylitis.   -  Now complicated by medial epicondylitis, likely compensatory mechanism. -  Signs of improvement clinically and on [place]. -  Intolerant of NTG.  Has attempted dry needling in the past with no  improvement. -  Continue voltaren gel and also menthol topical pain reliever ointment. -  Forearm home exercise program. -  Continue to wear elbow compression sleeve. -  Ice massage following workouts. -  Can continue ART and electrostim since has provided some improvement.    2.  Achilles tendonitis. -  Improving, continue eccentric stretching and strengthening exercises.    RTC in 2 months.

## 2012-04-23 ENCOUNTER — Encounter: Payer: Self-pay | Admitting: Family Medicine

## 2012-04-23 ENCOUNTER — Ambulatory Visit (INDEPENDENT_AMBULATORY_CARE_PROVIDER_SITE_OTHER): Payer: BC Managed Care – PPO | Admitting: Family Medicine

## 2012-04-23 VITALS — BP 92/60 | HR 56 | Ht 69.0 in | Wt 140.0 lb

## 2012-04-23 DIAGNOSIS — K645 Perianal venous thrombosis: Secondary | ICD-10-CM

## 2012-04-23 MED ORDER — HYDROCORTISONE 2.5 % RE CREA: TOPICAL_CREAM | Freq: Two times a day (BID) | RECTAL | Status: DC

## 2012-04-23 MED ORDER — HYDROCODONE-ACETAMINOPHEN 7.5-750 MG PO TABS: 1.0000 | ORAL_TABLET | Freq: Three times a day (TID) | ORAL | Status: DC | PRN

## 2012-04-23 NOTE — Progress Notes (Signed)
Chief Complaint  Patient presents with  . Hemorrhoids    x 10 days.    Hemorrhoids first noted 10 days ago after squatting with 225 pound of weight on her back.  No previous problems with hemorrhoids.  Hasn't had any bleeding. Hemorrhoids have been extremely painful. Bowel movements are very painful.  Straining due to the pain, and has had mild issues/constipation related to not wanting to have a BM due to the pain it causes.   Tried Sitz baths, Preparation H, Tuck's pads, witch hazel.  Has continued to lift (due to her job), but has tried to cut back, just demonstrating the exercise rather than doing the entire activity.  Pain has continued to worsen.  Uncomfortable now even to walk.  Size hasn't changed, and feels "kind of hard".  Past Medical History  Diagnosis Date  . Anxiety panic attacks  . Depression    Past Surgical History  Procedure Date  . 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 Marital Status: Divorced    Spouse Name: N/A    Number of Children: 0  . Years of Education: N/A   Occupational History  . Not on file.   Social History Main Topics  . Smoking status: Former Smoker    Types: Cigarettes    Quit date: 08/06/1996  . Smokeless tobacco: Never Used  . Alcohol Use: No  . Drug Use: No  . Sexually Active: Yes -- Female partner(s)    Birth Control/ Protection: Condom   Other Topics Concern  . 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  Teaching at [employer], maintenance (IT)), also teaching at [employer] (does the workouts, but doesn't workout when teaching a class, just demonstrating), and Level 5. Does Crossfit workouts 4-5 times/week   ROS:  Denies fevers, rectal bleeding, vomiting, URI symptoms, abdominal pain, or other complaints except as per HPI  PHYSICAL  EXAM: BP 92/60  Pulse 56  Ht 5\' 9"  (1.753 m)  Wt 140 lb (63.504 kg)  BMI 20.67 kg/m2  LMP 04/20/2012 Pleasant female in moderate discomfort due to hemorrhoids Rectal exam: moderately enlarged hemorrhoids posteriorly, bilaterally.  There is a small firm area and extreme tenderness on both sides within the hemorrhoid.  Anteriorly there is mild swelling of hemorrhoids, but these are not tender.  ASSESSMENT/PLAN: 1. Thrombosed external hemorrhoid  hydrocortisone (ANUSOL-HC) 2.5 % rectal cream, Incise external hemorrhoid   Thrombosed external hemorrhoids, bilaterally. Patient desired drainage of clot today, rather than be referred to surgeon.  Understands potential risks and alternatives to procedure and wishes to proceed  Area was initially treated with lidocaine gel, then anesthetized with 1% lidocaine with epinephrine.  11 blade was used to excise R posterior hemorrhoid first.  No bleeding noted, and unable to evacuate any clot.  Explored within hemorrhoid to break up septations/loculations, but still unable to express any clot.  Then proceeded to incise L hemorrhoid.  A clot was visualized, but was quite adherent and needed to be dissected away, and required making elliptical incision over the hemorrhoid.  There was no significant bleeding, and patient tolerated the procedure well  Wound care reviewed--recommended sitz baths, regular ongoing soaks.  If becomes very painful when anesthesia wears off, can use hydrocodone prn, and trial of icing. Recommended stool softeners.  Rx'd Anusol HC to help with external hemorrhoids--may wait a day or two to start (if causes stinging with open wounds).  Avoid lifting.  If has ongoing pain from R side, will need to refer to surgeon, as I was unable to remove any clot.  Ultimately may benefit from hemorrhoid banding if doesn't improve with topical rx medications.

## 2012-04-23 NOTE — Patient Instructions (Signed)

## 2019-11-18 ENCOUNTER — Encounter: Payer: Self-pay | Admitting: Family Medicine

## 2019-11-18 NOTE — Patient Instructions (Addendum)
  HEALTH MAINTENANCE RECOMMENDATIONS:  It is recommended that you get at least 30 minutes of aerobic exercise at least 5 days/week (for weight loss, you may need as much as 60-90 minutes). This can be any activity that gets your heart rate up. This can be divided in 10-15 minute intervals if needed, but try and build up your endurance at least once a week.  Weight bearing exercise is also recommended twice weekly.  Eat a healthy diet with lots of vegetables, fruits and fiber.  "Colorful" foods have a lot of vitamins (ie green vegetables, tomatoes, red peppers, etc).  Limit sweet tea, regular sodas and alcoholic beverages, all of which has a lot of calories and sugar.  Up to 1 alcoholic drink daily may be beneficial for women (unless trying to lose weight, watch sugars).  Drink a lot of water.  Calcium recommendations are 1200-1500 mg daily (1500 mg for postmenopausal women or women without ovaries), and vitamin D 1000 IU daily.  This should be obtained from diet and/or supplements (vitamins), and calcium should not be taken all at once, but in divided doses.  Monthly self breast exams and yearly mammograms for women over the age of 32 is recommended.  Sunscreen of at least SPF 30 should be used on all sun-exposed parts of the skin when outside between the hours of 10 am and 4 pm (not just when at beach or pool, but even with exercise, golf, tennis, and yard work!)  Use a sunscreen that says "broad spectrum" so it covers both UVA and UVB rays, and make sure to reapply every 1-2 hours.  Remember to change the batteries in your smoke detectors when changing your clock times in the spring and fall. Carbon monoxide detectors are recommended for your home.  Use your seat belt every time you are in a car, and please drive safely and not be distracted with cell phones and texting while driving.  I recommend getting the new shingles vaccine (Shingrix). You will need to check with your insurance to see if it  is covered, and whether it is less expensive to get at pharmacy vs our office.  It is a series of 2 injections, spaced 2 months apart. You should wait 4 weeks after the COVID vaccine before starting the series, and consider getting on a Friday due to side effects.  Please [facility] the [facility] and schedule the mammogram.   We decided to go with Cologuard for colon cancer screening (feel free to double check with your insurance while you have them on the phone).

## 2019-11-18 NOTE — Progress Notes (Signed)
Chief Complaint  Patient presents with  . Annual Exam    fasting annual exam with pap. Is going to schedule eye exam. Not sleeping, hot flashes, no periods (LMP 10/2018 and then sort of a period 06/2019).     Courtney Reid is a 49 y.o. female who presents to re-establish care, and for a complete physical.   She has the following concerns:  Hot flashes wake her up at night, then she has a hard time getting back to sleep. Last night was up 2-4am.  Sometimes her brain won't shut down once she is up. Doesn't have any trouble falling asleep. Has to get up once to void if she drinks anything after 7, and then can't get back to sleep (between her mind and feeling hot/cold).  Sometimes doesn't want to be around people, sometimes related to being tired from not sleeping well. See full PHQ-9 screen.    No longer drinks any alcohol.  Has Kombucha and coconut every evening as her ritual instead of wine.  Immunization History  Administered Date(s) Administered  . PFIZER SARS-COV-2 Vaccination 10/15/2019, 11/05/2019  . Td 08/06/2001  . 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 Has gym at work, works out daily. Vitamin D-OH 45 in 01/2012  Eats a lot of leafy greens.  Drinks non-dairy milks (8-12 oz daily).   PMH, PSH, SH and FH were reviewed and updated  Outpatient Encounter Medications as of 11/19/2019  Medication Sig  . BLACK COHOSH PO Take 2 capsules by mouth daily.  Marland Kitchen EVENING PRIMROSE OIL PO Take 2 capsules by mouth daily.  Marland Kitchen MAGNESIUM PO Take 800 mg by mouth daily.  Marland Kitchen zinc gluconate 50 MG tablet Take 100 mg by mouth daily.  . [DISCONTINUED] ALPRAZolam (XANAX) 0.5 MG tablet Take 0.5 mg by mouth as needed.    .  [DISCONTINUED] diclofenac sodium (VOLTAREN) 1 % GEL Use 1-2g to the affected area tid-qid  . [DISCONTINUED] HYDROcodone-acetaminophen (VICODIN ES) 7.5-750 MG per tablet Take 1 tablet by mouth every 8 (eight) hours as needed for pain.  . [DISCONTINUED] hydrocortisone (ANUSOL-HC) 2.5 % rectal cream Place rectally 2 (two) times daily.  . [DISCONTINUED] ibuprofen (ADVIL,MOTRIN) 200 MG tablet Take 1,000 mg by mouth 2 (two) times daily.  . [DISCONTINUED] Omega-3 Fatty Acids (FISH OIL PO) Take 750 mg by mouth daily.   No facility-administered encounter medications on file as of 11/19/2019.   No Known Allergies   ROS:  The patient denies anorexia, fever, weight changes (had lost some when she moved and started working, regained some, overall stable); no headaches,  vision changes, decreased hearing, ear pain, sore throat, breast concerns, chest pain, palpitations, dizziness, syncope, dyspnea on exertion, cough, swelling, nausea, vomiting, diarrhea, constipation, abdominal pain, melena, hematochezia, indigestion/heartburn, hematuria, incontinence, dysuria, vaginal discharge, odor or itch, genital lesions, numbness, tingling, weakness, tremor, suspicious skin lesions, abnormal bleeding/bruising, or enlarged lymph nodes. Menses are irregular--had one 10/2018, and only some spotting in11/2020  +hot flashes and night sweats Some decrease in vision, has eye exam scheduled. Hasn't had any panic attacks. Some medial elbow pain related to work.  Uses e-stim occasionally, modifies workouts when flares Some R>L shoulder pain when she overdoes things. Trouble sleeping per HPI, mainly related to hot  flashes and some stress. Some irritability.  Often lacks self confidence (lifelong), feels she should do better.   PHYSICAL EXAM:  BP 100/62   Pulse (!) 52   Temp 98.2 F (36.8 C) (Other (Comment))   Ht 5' 8.5" (1.74 m)   Wt 145 lb 9.6 oz (66 kg)   LMP 06/08/2019 (Approximate)   BMI 21.82 kg/m   Wt Readings  from Last 3 Encounters:  11/19/19 145 lb 9.6 oz (66 kg)  04/23/12 140 lb (63.5 kg)  03/25/12 155 lb (70.3 kg)    General Appearance:    Alert, cooperative, no distress, appears stated age  Head:    Normocephalic, without obvious abnormality, atraumatic  Eyes:    PERRL, conjunctiva/corneas clear, EOM's intact, fundi    benign  Ears:    Normal TM's and external ear canals  Nose:   Not examined, wearing mask due to COVID-19 pandemic  Throat:   Not examined, wearing mask due to COVID-19 pandemic  Neck:   Supple, no lymphadenopathy;  thyroid:  no enlargement/ tenderness/nodules; no carotid bruit or JVD  Back:    Spine nontender, no curvature, ROM normal, no CVA     tenderness  Lungs:     Clear to auscultation bilaterally without wheezes, rales or     ronchi; respirations unlabored  Chest Wall:    No tenderness or deformity   Heart:    Regular rate and rhythm, S1 and S2 normal, no murmur, rub   or gallop  Breast Exam:    No tenderness, masses, or nipple discharge or inversion.      No axillary lymphadenopathy  Abdomen:     Soft, non-tender, nondistended, normoactive bowel sounds,    no masses, no hepatosplenomegaly. WHSS  Genitalia:    Normal external genitalia without lesions.  BUS and vagina normal; cervix without lesions, or cervical motion tenderness. No abnormal vaginal discharge.  Uterus and adnexa not enlarged, nontender, no masses.  Pap performed  Rectal:    Normal tone, no masses or tenderness; guaiac negative stool  Extremities:   No clubbing, cyanosis or edema  Pulses:   2+ and symmetric all extremities  Skin:   Skin color, texture, turgor normal, no rashes or lesions  Lymph nodes:   Cervical, supraclavicular, and axillary nodes normal  Neurologic:   CNII-XII intact, normal strength, sensation and gait; reflexes 2+ and symmetric throughout          Psych:   Normal mood, affect, hygiene and grooming.     PHQ-9 score of 9 See epic for full screen. Mentioned long-term issues with  some self-doubt Issues with sleep and being tired(contributing to some irritability)   ASSESSMENT/PLAN:  Annual physical exam - Plan: POCT Urinalysis DIP (Proadvantage Device), Lipid panel, Comprehensive metabolic panel, CBC with Differential/Platelet, TSH, Follicle Stimulating Hormone, HIV Antibody (routine testing w rflx), Cytology - PAP([place])  Colon cancer screening - discussed colonoscopy and Cologuard, prefers Cologuard. Understands need for colonoscopy if abnormal - Plan: Cologuard  Postmenopausal symptoms - discussed OTC supplements. Can consider HRT if has normal mammogram. - Plan: TSH, Follicle Stimulating Hormone  Irregular menses - suspect menopause - Plan: TSH  Patient will try and find her vaccine history (which is in [place] in a box somewhere). Tdap next year. Reviewed risks/SE shingrix, recommended.  Pap CBC, c-met, lipids , HIV, FSH, TSH Mammogram (can get in [place] if more convenient) Cologuard   Discussed monthly self breast exams and yearly mammograms; at least 30 minutes of aerobic activity at  least 5 days/week, weight-bearing exercise at least 2x/week; proper sunscreen use reviewed; healthy diet, including goals of calcium and vitamin D intake and alcohol recommendations (less than or equal to 1 drink/day) reviewed; regular seatbelt use; changing batteries in smoke detectors, carbon monoxide detectors for home.  Immunization recommendations discussed--continue yearly flu shots, Shingrix recommended.  Colon cancer screening recommendations discussed, due. Discussed colonoscopy and Cologuard, prefers Cologuard.

## 2019-11-19 ENCOUNTER — Other Ambulatory Visit: Payer: Self-pay

## 2019-11-19 ENCOUNTER — Encounter: Payer: Self-pay | Admitting: Family Medicine

## 2019-11-19 ENCOUNTER — Ambulatory Visit: Payer: BC Managed Care – PPO | Admitting: Family Medicine

## 2019-11-19 ENCOUNTER — Other Ambulatory Visit (HOSPITAL_COMMUNITY)
Admission: RE | Admit: 2019-11-19 | Discharge: 2019-11-19 | Disposition: A | Discharge status: Home / Self Care | Payer: BC Managed Care – PPO | Source: Ambulatory Visit | Attending: Family Medicine | Admitting: Family Medicine

## 2019-11-19 VITALS — BP 100/62 | HR 52 | Temp 98.2°F | Ht 68.5 in | Wt 145.6 lb

## 2019-11-19 DIAGNOSIS — Z0000 Encounter for general adult medical examination without abnormal findings: Secondary | ICD-10-CM | POA: Insufficient documentation

## 2019-11-19 DIAGNOSIS — N926 Irregular menstruation, unspecified: Secondary | ICD-10-CM

## 2019-11-19 DIAGNOSIS — Z1211 Encounter for screening for malignant neoplasm of colon: Secondary | ICD-10-CM | POA: Diagnosis not present

## 2019-11-19 DIAGNOSIS — N959 Unspecified menopausal and perimenopausal disorder: Secondary | ICD-10-CM | POA: Diagnosis not present

## 2019-11-19 LAB — POCT URINALYSIS DIP (PROADVANTAGE DEVICE)
Bilirubin, UA: NEGATIVE
Blood, UA: NEGATIVE
Glucose, UA: NEGATIVE mg/dL
Ketones, POC UA: NEGATIVE mg/dL
Leukocytes, UA: NEGATIVE
Nitrite, UA: NEGATIVE
Protein Ur, POC: NEGATIVE mg/dL
Specific Gravity, Urine: 1.01
Urobilinogen, Ur: NEGATIVE
pH, UA: 6.5 (ref 5.0–8.0)

## 2019-11-20 ENCOUNTER — Encounter: Payer: Self-pay | Admitting: Family Medicine

## 2019-11-20 LAB — CBC WITH DIFFERENTIAL/PLATELET
Basophils Absolute: 0 10*3/uL (ref 0.0–0.2)
Basos: 0 %
EOS (ABSOLUTE): 0.1 10*3/uL (ref 0.0–0.4)
Eos: 1 %
Hematocrit: 43.1 % (ref 34.0–46.6)
Hemoglobin: 14.6 g/dL (ref 11.1–15.9)
Immature Grans (Abs): 0 10*3/uL (ref 0.0–0.1)
Immature Granulocytes: 0 %
Lymphocytes Absolute: 2.3 10*3/uL (ref 0.7–3.1)
Lymphs: 25 %
MCH: 32.2 pg (ref 26.6–33.0)
MCHC: 33.9 g/dL (ref 31.5–35.7)
MCV: 95 fL (ref 79–97)
Monocytes Absolute: 0.9 10*3/uL (ref 0.1–0.9)
Monocytes: 10 %
Neutrophils Absolute: 5.9 10*3/uL (ref 1.4–7.0)
Neutrophils: 64 %
Platelets: 269 10*3/uL (ref 150–450)
RBC: 4.54 x10E6/uL (ref 3.77–5.28)
RDW: 12.6 % (ref 11.7–15.4)
WBC: 9.2 10*3/uL (ref 3.4–10.8)

## 2019-11-20 LAB — LIPID PANEL
Chol/HDL Ratio: 4.5 ratio — ABNORMAL HIGH (ref 0.0–4.4)
Cholesterol, Total: 183 mg/dL (ref 100–199)
HDL: 41 mg/dL (ref >39)
LDL Chol Calc (NIH): 111 mg/dL — ABNORMAL HIGH (ref 0–99)
Triglycerides: 174 mg/dL — ABNORMAL HIGH (ref 0–149)
VLDL Cholesterol Cal: 31 mg/dL (ref 5–40)

## 2019-11-20 LAB — COMPREHENSIVE METABOLIC PANEL
ALT: 16 IU/L (ref 0–32)
AST: 23 IU/L (ref 0–40)
Albumin/Globulin Ratio: 2 (ref 1.2–2.2)
Albumin: 4.9 g/dL — ABNORMAL HIGH (ref 3.8–4.8)
Alkaline Phosphatase: 77 IU/L (ref 39–117)
BUN/Creatinine Ratio: 13 (ref 9–23)
BUN: 13 mg/dL (ref 6–24)
Bilirubin Total: 0.3 mg/dL (ref 0.0–1.2)
CO2: 24 mmol/L (ref 20–29)
Calcium: 9.8 mg/dL (ref 8.7–10.2)
Chloride: 101 mmol/L (ref 96–106)
Creatinine, Ser: 0.98 mg/dL (ref 0.57–1.00)
GFR calc Af Amer: 78 mL/min/{1.73_m2} (ref >59)
GFR calc non Af Amer: 67 mL/min/{1.73_m2} (ref >59)
Globulin, Total: 2.4 g/dL (ref 1.5–4.5)
Glucose: 89 mg/dL (ref 65–99)
Potassium: 4.6 mmol/L (ref 3.5–5.2)
Sodium: 142 mmol/L (ref 134–144)
Total Protein: 7.3 g/dL (ref 6.0–8.5)

## 2019-11-20 LAB — FOLLICLE STIMULATING HORMONE: FSH: 54.1 m[IU]/mL

## 2019-11-20 LAB — TSH: TSH: 1.91 u[IU]/mL (ref 0.450–4.500)

## 2019-11-20 LAB — HIV ANTIBODY (ROUTINE TESTING W REFLEX): HIV Screen 4th Generation wRfx: NONREACTIVE

## 2019-11-23 LAB — CYTOLOGY - PAP
Chlamydia: NEGATIVE
Comment: NEGATIVE
Comment: NEGATIVE
Diagnosis: NEGATIVE
High risk HPV: NEGATIVE

## 2019-11-30 DIAGNOSIS — Z1211 Encounter for screening for malignant neoplasm of colon: Secondary | ICD-10-CM | POA: Diagnosis not present

## 2019-12-03 DIAGNOSIS — Z1231 Encounter for screening mammogram for malignant neoplasm of breast: Secondary | ICD-10-CM | POA: Diagnosis not present

## 2019-12-03 LAB — HM MAMMOGRAPHY

## 2019-12-04 LAB — COLOGUARD: Cologuard: NEGATIVE

## 2019-12-07 ENCOUNTER — Encounter: Payer: Self-pay | Admitting: *Deleted

## 2020-10-05 NOTE — Progress Notes (Signed)
Chief Complaint  Patient presents with  . Annual Exam    Fasting annual exam with pelvic.     Courtney Reid is a 50 y.o. female who presents for a complete physical, with chief complaint of weight loss.  Stools are looser in the last 4-6 weeks.  Not watery, no blood or mucus. She generally avoids dairy and gluten in her diet (feels worse after eating, limits), though she has been having some cinnamon toast recently. She has frequent "uneasiness" in her stomach, not pain.  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  She worries that her office will close. She is constantly second guessing herself--did she forget things, did she do things correctly, and these things constantly run through her head, especially at night.  She doesn't like making mistakes.  Angry at herself if she forgets to do something.  Recalls previously taking wellbutrin, cymbalta. She thinks she took Lexapro after losing baby in 2005.   Cymbalta was the most recent (years ago)--she can't recall if she had any side effects or issues with it. No panic attacks in many years, 2009  Menopausal symptoms--Last full cycle was 10/2018, had some spotting 06/2019, none since.  She previously had issues with hot flashes--they had resolved, and didn't recur once she ran out of the supplements she was taking.  They stayed away for a while, but recurred about a month ago "with a vengence".  Hot flashes wake her up at night, then she has a hard time getting back to sleep. She has been up since 2 am. She is now getting some hot flashes during the day, and will alternate between feeling hot and freezing, feels like she can't regulate her body temperature. Has some trouble falling asleep now as well (didn't last year).  She is still not drinking any alcohol.  Has Kombucha and coconut water instead of wine, but no longer every night.  Dyslipidemia:  TG elevated last year, HDL mildly low, elevated  chol/HDL ratio. No changes to diet, other than less frequent Kombucha/coconut water. She is a "gummy bear whore"--a few daily, not large portions. Some ginger chews after lunch. They are sweet, but also help settle her stomach.  Lab Results  Component Value Date   CHOL 183 11/19/2019   HDL 41 11/19/2019   LDLCALC 111 (H) 11/19/2019   TRIG 174 (H) 11/19/2019   CHOLHDL 4.5 (H) 11/19/2019    Immunization History  Administered Date(s) Administered  . PFIZER(Purple Top)SARS-COV-2 Vaccination 10/15/2019, 11/05/2019  . Td 08/06/2001  . 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 Has gym at work, works out daily. Less intense than previously recently  Vitamin D-OH 45 in 01/2012 Eats a lot of leafy greens.  No longer using non-dairy milk in her smoothies (using beet juice, coconut water instead).   PMH, PSH, SH and FH were reviewed and updated  Meds:  None, and no vitamins or supplements currently.  No Known Allergies   ROS:  The patient denies anorexia, fever, headaches, vision changes, decreased hearing, ear pain, sore throat, breast concerns,  chest pain, palpitations, dizziness, syncope, dyspnea on exertion, cough, swelling, nausea, vomiting, constipation, abdominal pain, melena, hematochezia, indigestion/heartburn, hematuria, incontinence, dysuria, vaginal bleeding, discharge, odor or itch, genital lesions, numbness, tingling, weakness, tremor, suspicious skin lesions, abnormal bleeding/bruising, or enlarged lymph nodes.  +hot flashes and night sweats Hasn't had any panic  attacks. +depression/anxiety per HPI Intermittent bilateral medial elbow pain related to work.  Uses e-stim occasionally Some bilateral shoulder pain when she overdoes things (sometimes will avoid pull-ups, modifies workouts) Trouble sleeping per HPI, mainly related to hot flashes and some stress. +weight loss Loose stools per HPI No heartburn or indigestion, no abdominal pain, just "unsettled" stomach   PHYSICAL EXAM:  BP 110/78   Pulse (!) 44   Ht 5' 8.5" (1.74 m)   Wt 129 lb 9.6 oz (58.8 kg)   LMP 10/05/2018   BMI 19.42 kg/m   Wt Readings from Last 3 Encounters:  10/06/20 129 lb 9.6 oz (58.8 kg)  11/19/19 145 lb 9.6 oz (66 kg)  04/23/12 140 lb (63.5 kg)    General Appearance:    Alert, cooperative, no distress, appears stated age  Head:    Normocephalic, without obvious abnormality, atraumatic  Eyes:    PERRL, conjunctiva/corneas clear, EOM's intact, fundi    benign  Ears:    Normal TM's and external ear canals  Nose:   Not examined, wearing mask due to COVID-19 pandemic  Throat:   Not examined, wearing mask due to COVID-19 pandemic  Neck:   Supple, no lymphadenopathy;  thyroid:  no enlargement/ tenderness/nodules; no carotid bruit or JVD  Back:    Spine nontender, no curvature, ROM normal, no CVA     tenderness  Lungs:     Clear to auscultation bilaterally without wheezes, rales or     ronchi; respirations unlabored  Chest Wall:    No tenderness or deformity   Heart:    Regular rate and rhythm, S1 and S2 normal, no murmur, rub   or gallop  Breast Exam:    No tenderness, masses, or nipple discharge or inversion.      No axillary lymphadenopathy  Abdomen:     Soft, non-tender, nondistended, normoactive bowel sounds,    no masses, no hepatosplenomegaly. WHSS  Genitalia:    Normal external genitalia without lesions.  BUS and vagina normal; no cervical motion tenderness. No abnormal vaginal discharge.  Uterus normal. Mildly tender L>R adnexa, R is nonpalpable, L ovary is  mildly tender, not enlarged. Pap not performed  Rectal:    Normal tone, no masses or tenderness; guaiac negative stool  Extremities:   No clubbing, cyanosis or edema  Pulses:   2+ and symmetric all extremities  Skin:   Skin color, texture, turgor normal, no rashes or lesions  Lymph nodes:   Cervical, supraclavicular, and axillary nodes normal  Neurologic:   Normal strength, sensation and gait; reflexes 2+ and symmetric throughout          Psych:   Normal mood, affect, hygiene and grooming.   PHQ-9 score of 18 GAD-7 score of 17   ASSESSMENT/PLAN:  Annual physical exam - Plan: Visual acuity screening, POCT Urinalysis DIP (Proadvantage Device), Lipid panel, Comprehensive metabolic panel, CBC with Differential/Platelet, VITAMIN D 25 Hydroxy (Vit-D Deficiency, Fractures), TSH, Celiac Ab tTG DGP TIgA  Hypertriglyceridemia - reviewed lowfat diet, recheck today - Plan: Lipid panel  Weight loss, unintentional - suspect stress (depression/anxiety) is a factor.  Given GI sx, will r/o celiac, and evaluate for thyroid d/o - Plan:  Comprehensive metabolic panel, CBC with Differential/Platelet, TSH, Celiac Ab tTG DGP TIgA  Generalized anxiety disorder - Encouraged counseling.  start Cymbalta. Also discussed zoloft/paxil/pristiq. Prev took Cymbalta, will start there - Plan: DULoxetine (CYMBALTA) 30 MG capsule, DULoxetine (CYMBALTA) 60 MG capsule  Moderate recurrent major depression (HCC) - start Cymbalta; counseling encouraged - Plan: TSH, DULoxetine (CYMBALTA) 30 MG capsule, DULoxetine (CYMBALTA) 60 MG capsule  Postmenopausal - Symptomatic.  Reviewed OTC supplements.  SNRI may help some  Low threshold for pelvic US or CT scan if GI complaints and weight loss not improving. Briefly discussed ovarian pathology as being nonspecific, hard to diagnose. Will re-eval need for this at f/u visit.  Discussed monthly self breast exams and yearly mammograms; at least 30 minutes of aerobic activity at least 5  days/week, weight-bearing exercise at least 2x/week; proper sunscreen use reviewed; healthy diet, including goals of calcium and vitamin D intake and alcohol recommendations (less than or equal to 1 drink/day) reviewed; regular seatbelt use; changing batteries in smoke detectors, carbon monoxide detectors for home.  Immunization recommendations discussed--yearly flu shots recommended (she declines), Shingrix recommended (reviewed SE, to check insurance and schedule NV when convenient). Tdap and COVID booster due. She declined these today, will get on a Friday related to SE.  Colon cancer screening UTD, had negative Cologuard 11/2019, due again 11/2022.  F/u 6 weeks (can be virtual). CPE 1 year.   Cymbalta (duloxetine)--start at 30mg  once daily. After a week, if tolerated, increase to 2 capsules (60mg ).  If it worsens insomnia, switch to morning (if causes sedation, take at night) When you run out of the 30mg  prescription, pick up the 60mg  capsule that is also being sent today to the pharmacy. I highly encourage counseling--please start this right away (can help while waiting for the medications to help).  The cymbalta might help with hot flashes, but you can also use herbal supplements (Estroven--black cohosh and soy, vs others).  You can use melatonin if needed to help you get to sleep.

## 2020-10-05 NOTE — Patient Instructions (Addendum)
HEALTH MAINTENANCE RECOMMENDATIONS:  It is recommended that you get at least 30 minutes of aerobic exercise at least 5 days/week (for weight loss, you may need as much as 60-90 minutes). This can be any activity that gets your heart rate up. This can be divided in 10-15 minute intervals if needed, but try and build up your endurance at least once a week.  Weight bearing exercise is also recommended twice weekly.  Eat a healthy diet with lots of vegetables, fruits and fiber.  "Colorful" foods have a lot of vitamins (ie green vegetables, tomatoes, red peppers, etc).  Limit sweet tea, regular sodas and alcoholic beverages, all of which has a lot of calories and sugar.  Up to 1 alcoholic drink daily may be beneficial for women (unless trying to lose weight, watch sugars).  Drink a lot of water.  Calcium recommendations are 1200-1500 mg daily (1500 mg for postmenopausal women or women without ovaries), and vitamin D 1000 IU daily.  This should be obtained from diet and/or supplements (vitamins), and calcium should not be taken all at once, but in divided doses.  Monthly self breast exams and yearly mammograms for women over the age of 42 is recommended.  Sunscreen of at least SPF 30 should be used on all sun-exposed parts of the skin when outside between the hours of 10 am and 4 pm (not just when at beach or pool, but even with exercise, golf, tennis, and yard work!)  Use a sunscreen that says "broad spectrum" so it covers both UVA and UVB rays, and make sure to reapply every 1-2 hours.  Remember to change the batteries in your smoke detectors when changing your clock times in the spring and fall. Carbon monoxide detectors are recommended for your home.  Use your seat belt every time you are in a car, and please drive safely and not be distracted with cell phones and texting while driving.  You are due for a TdaP in April, and your COVID booster.  You should either get them the same day (with 2 very  sore arms), or separate them by 2 weeks.  You can get them here as a nurse visit, or the COVID booster closer to home. If you want the Tdap from pharmacy also, check your insurance to see if there is a cost difference.  You are also due for Shingrix--this should be separated from other vaccines as well, by at least 2 weeks (and take on a Friday due to side effects. This is 2 shots, given 2 months apart. Verify coverage with your insurance before getting.  Send a copy of your immunization record (through MyChart) when you can.  Consider getting a routine eye exam.  Cymbalta (duloxetine)--start at 30mg  once daily. After a week, if tolerated, increase to 2 capsules (60mg ).  If it worsens insomnia, switch to morning (if causes sedation, take at night) When you run out of the 30mg  prescription, pick up the 60mg  capsule that is also being sent today to the pharmacy. I highly encourage counseling--please start this right away (can help while waiting for the medications to help).  The cymbalta might help with hot flashes, but you can also use herbal supplements (Estroven--black cohosh and soy, vs others).  You can use melatonin if needed to help you get to sleep.   You need to get more calcium in your diet.   Calcium Content in Foods Calcium is the most abundant mineral in the body. Most of the body's calcium supply  is stored in bones and teeth. Calcium helps many parts of the body function normally, including:  Blood and blood vessels.  Nerves.  Hormones.  Muscles.  Bones and teeth. When your calcium stores are low, you may be at risk for low bone mass, bone loss, and broken bones (fractures). When you get enough calcium, it helps to support strong bones and teeth throughout your life. Calcium is especially important for:  Children during growth spurts.  Girls during adolescence.  Women who are pregnant or breastfeeding.  Women after their menstrual cycle stops  (postmenopause).  Women whose menstrual cycle has stopped due to anorexia nervosa or regular intense exercise.  People who cannot eat or digest dairy products.  Vegans. Recommended daily amounts of calcium:  Women (ages 16 to 58): 1,000 mg per day.  Women (ages 87 and older): 1,200 mg per day.  Men (ages 65 to 49): 1,000 mg per day.  Men (ages 37 and older): 1,200 mg per day.  Women (ages 49 to 95): 1,300 mg per day.  Men (ages 76 to 8): 1,300 mg per day. General information  Eat foods that are high in calcium. Try to get most of your calcium from food.  Some people may benefit from taking calcium supplements. Check with your health care provider or diet and nutrition specialist (dietitian) before starting any calcium supplements. Calcium supplements may interact with certain medicines. Too much calcium may cause other health problems, such as constipation and kidney stones.  For the body to absorb calcium, it needs vitamin D. Sources of vitamin D include: ? Skin exposure to direct sunlight. ? Foods, such as egg yolks, liver, mushrooms, saltwater fish, and fortified milk. ? Vitamin D supplements. Check with your health care provider or dietitian before starting any vitamin D supplements. What foods are high in calcium? Foods that are high in calcium contain more than 100 milligrams per serving. Fruits  Fortified orange juice or other fruit juice, 300 mg per 8 oz serving. Vegetables  Collard greens, 360 mg per 8 oz serving.  Kale, 100 mg per 8 oz serving.  Bok choy, 160 mg per 8 oz serving. Grains  Fortified ready-to-eat cereals, 100 to 1,000 mg per 8 oz serving.  Fortified frozen waffles, 200 mg in 2 waffles.  Oatmeal, 140 mg in 1 cup. Meats and other proteins  Sardines, canned with bones, 325 mg per 3 oz serving.  Salmon, canned with bones, 180 mg per 3 oz serving.  Canned shrimp, 125 mg per 3 oz serving.  Baked beans, 160 mg per 4 oz serving.  Tofu, firm,  made with calcium sulfate, 253 mg per 4 oz serving. Dairy  Yogurt, plain, low-fat, 310 mg per 6 oz serving.  Nonfat milk, 300 mg per 8 oz serving.  American cheese, 195 mg per 1 oz serving.  Cheddar cheese, 205 mg per 1 oz serving.  Cottage cheese 2%, 105 mg per 4 oz serving.  Fortified soy, rice, or almond milk, 300 mg per 8 oz serving.  Mozzarella, part skim, 210 mg per 1 oz serving. The items listed above may not be a complete list of foods high in calcium. Actual amounts of calcium may be different depending on processing. Contact a dietitian for more information.   What foods are lower in calcium? Foods that are lower in calcium contain 50 mg or less per serving. Fruits  Apple, about 6 mg.  Banana, about 12 mg. Vegetables  Lettuce, 19 mg per 2 oz serving.  Tomato, about 11 mg. Grains  Rice, 4 mg per 6 oz serving.  Boiled potatoes, 14 mg per 8 oz serving.  White bread, 6 mg per slice. Meats and other proteins  Egg, 27 mg per 2 oz serving.  Red meat, 7 mg per 4 oz serving.  Chicken, 17 mg per 4 oz serving.  Fish, cod, or trout, 20 mg per 4 oz serving. Dairy  Cream cheese, regular, 14 mg per 1 Tbsp serving.  Brie cheese, 50 mg per 1 oz serving.  Parmesan cheese, 70 mg per 1 Tbsp serving. The items listed above may not be a complete list of foods lower in calcium. Actual amounts of calcium may be different depending on processing. Contact a dietitian for more information. Summary  Calcium is an important mineral in the body because it affects many functions. Getting enough calcium helps support strong bones and teeth throughout your life.  Try to get most of your calcium from food.  Calcium supplements may interact with certain medicines. Check with your health care provider or dietitian before starting any calcium supplements. This information is not intended to replace advice given to you by your health care provider. Make sure you discuss any questions  you have with your health care provider. Document Revised: 11/18/2019 Document Reviewed: 11/18/2019 Elsevier Patient Education  2021 [employer].

## 2020-10-06 ENCOUNTER — Encounter: Payer: Self-pay | Admitting: Family Medicine

## 2020-10-06 ENCOUNTER — Other Ambulatory Visit: Payer: Self-pay

## 2020-10-06 ENCOUNTER — Ambulatory Visit (INDEPENDENT_AMBULATORY_CARE_PROVIDER_SITE_OTHER): Payer: BC Managed Care – PPO | Admitting: Family Medicine

## 2020-10-06 VITALS — BP 110/78 | HR 44 | Ht 68.5 in | Wt 129.6 lb

## 2020-10-06 DIAGNOSIS — F411 Generalized anxiety disorder: Secondary | ICD-10-CM | POA: Diagnosis not present

## 2020-10-06 DIAGNOSIS — E781 Pure hyperglyceridemia: Secondary | ICD-10-CM

## 2020-10-06 DIAGNOSIS — F331 Major depressive disorder, recurrent, moderate: Secondary | ICD-10-CM | POA: Diagnosis not present

## 2020-10-06 DIAGNOSIS — Z780 Asymptomatic menopausal state: Secondary | ICD-10-CM

## 2020-10-06 DIAGNOSIS — Z0000 Encounter for general adult medical examination without abnormal findings: Secondary | ICD-10-CM

## 2020-10-06 DIAGNOSIS — E559 Vitamin D deficiency, unspecified: Secondary | ICD-10-CM

## 2020-10-06 DIAGNOSIS — R634 Abnormal weight loss: Secondary | ICD-10-CM

## 2020-10-06 LAB — POCT URINALYSIS DIP (PROADVANTAGE DEVICE)
Bilirubin, UA: NEGATIVE
Blood, UA: NEGATIVE
Glucose, UA: NEGATIVE mg/dL
Ketones, POC UA: NEGATIVE mg/dL
Leukocytes, UA: NEGATIVE
Nitrite, UA: NEGATIVE
Protein Ur, POC: NEGATIVE mg/dL
Specific Gravity, Urine: 1.01
Urobilinogen, Ur: NEGATIVE
pH, UA: 6.5 (ref 5.0–8.0)

## 2020-10-06 MED ORDER — DULOXETINE HCL 30 MG PO CPEP: ORAL_CAPSULE | ORAL | 0 refills | Status: DC

## 2020-10-06 MED ORDER — DULOXETINE HCL 60 MG PO CPEP: 60.0000 mg | ORAL_CAPSULE | Freq: Every day | ORAL | 1 refills | Status: DC

## 2020-10-07 LAB — CBC WITH DIFFERENTIAL/PLATELET
Basophils Absolute: 0 10*3/uL (ref 0.0–0.2)
Basos: 1 %
EOS (ABSOLUTE): 0 10*3/uL (ref 0.0–0.4)
Eos: 1 %
Hematocrit: 42.2 % (ref 34.0–46.6)
Hemoglobin: 14.7 g/dL (ref 11.1–15.9)
Immature Grans (Abs): 0 10*3/uL (ref 0.0–0.1)
Immature Granulocytes: 0 %
Lymphocytes Absolute: 1.9 10*3/uL (ref 0.7–3.1)
Lymphs: 30 %
MCH: 32.5 pg (ref 26.6–33.0)
MCHC: 34.8 g/dL (ref 31.5–35.7)
MCV: 93 fL (ref 79–97)
Monocytes Absolute: 0.6 10*3/uL (ref 0.1–0.9)
Monocytes: 10 %
Neutrophils Absolute: 3.7 10*3/uL (ref 1.4–7.0)
Neutrophils: 58 %
Platelets: 262 10*3/uL (ref 150–450)
RBC: 4.53 x10E6/uL (ref 3.77–5.28)
RDW: 12.7 % (ref 11.7–15.4)
WBC: 6.2 10*3/uL (ref 3.4–10.8)

## 2020-10-07 LAB — COMPREHENSIVE METABOLIC PANEL
ALT: 11 IU/L (ref 0–32)
AST: 20 IU/L (ref 0–40)
Albumin/Globulin Ratio: 2.2 (ref 1.2–2.2)
Albumin: 5 g/dL — ABNORMAL HIGH (ref 3.8–4.9)
Alkaline Phosphatase: 76 IU/L (ref 44–121)
BUN/Creatinine Ratio: 10 (ref 9–23)
BUN: 9 mg/dL (ref 6–24)
Bilirubin Total: 0.5 mg/dL (ref 0.0–1.2)
CO2: 22 mmol/L (ref 20–29)
Calcium: 9.7 mg/dL (ref 8.7–10.2)
Chloride: 102 mmol/L (ref 96–106)
Creatinine, Ser: 0.91 mg/dL (ref 0.57–1.00)
Globulin, Total: 2.3 g/dL (ref 1.5–4.5)
Glucose: 93 mg/dL (ref 65–99)
Potassium: 4.8 mmol/L (ref 3.5–5.2)
Sodium: 140 mmol/L (ref 134–144)
Total Protein: 7.3 g/dL (ref 6.0–8.5)
eGFR: 76 mL/min/{1.73_m2} (ref >59)

## 2020-10-07 LAB — LIPID PANEL
Chol/HDL Ratio: 3.3 ratio (ref 0.0–4.4)
Cholesterol, Total: 172 mg/dL (ref 100–199)
HDL: 52 mg/dL (ref >39)
LDL Chol Calc (NIH): 106 mg/dL — ABNORMAL HIGH (ref 0–99)
Triglycerides: 71 mg/dL (ref 0–149)
VLDL Cholesterol Cal: 14 mg/dL (ref 5–40)

## 2020-10-07 LAB — CELIAC AB TTG DGP TIGA
Antigliadin Abs, IgA: 5 units (ref 0–19)
Gliadin IgG: 2 units (ref 0–19)
IgA/Immunoglobulin A, Serum: 128 mg/dL (ref 87–352)
Tissue Transglut Ab: <2 U/mL (ref 0–5)
Transglutaminase IgA: <2 U/mL (ref 0–3)

## 2020-10-07 LAB — VITAMIN D 25 HYDROXY (VIT D DEFICIENCY, FRACTURES): Vit D, 25-Hydroxy: 17.7 ng/mL — ABNORMAL LOW (ref 30.0–100.0)

## 2020-10-07 LAB — TSH: TSH: 1.09 u[IU]/mL (ref 0.450–4.500)

## 2020-10-07 MED ORDER — VITAMIN D (ERGOCALCIFEROL) 1.25 MG (50000 UNIT) PO CAPS: 50000.0000 [IU] | ORAL_CAPSULE | ORAL | 0 refills | Status: DC

## 2020-10-10 ENCOUNTER — Encounter: Payer: Self-pay | Admitting: *Deleted

## 2020-10-10 ENCOUNTER — Ambulatory Visit: Payer: BC Managed Care – PPO | Admitting: Family Medicine

## 2020-10-10 NOTE — Progress Notes (Signed)
I do remember! I went back and ordered vision and put in her chart. She did without glasses R-20/40 L-20/30 B-20/25. Ans as far as the HM, you have to go to the HM after abstracting the actual colonoscopy/cologuard and manually update the HM-has always been that way. Not sure if I forgot. Thanks.

## 2020-10-10 NOTE — Addendum Note (Signed)
Addended by: Debbrah Alar F on: 10/10/2020 01:54 PM   Modules accepted: Orders

## 2020-10-11 ENCOUNTER — Encounter: Payer: Self-pay | Admitting: Family Medicine

## 2020-11-03 ENCOUNTER — Other Ambulatory Visit: Payer: Self-pay | Admitting: Family Medicine

## 2020-11-03 DIAGNOSIS — F411 Generalized anxiety disorder: Secondary | ICD-10-CM

## 2020-11-03 DIAGNOSIS — F331 Major depressive disorder, recurrent, moderate: Secondary | ICD-10-CM

## 2020-11-04 DIAGNOSIS — U071 COVID-19: Secondary | ICD-10-CM

## 2020-11-04 HISTORY — DX: COVID-19: U07.1

## 2020-11-16 NOTE — Progress Notes (Signed)
Start time: 11:30 End time: 12:13  Virtual Visit via Video Note  I connected with Courtney Reid on 11/17/2020 by a video enabled telemedicine application and verified that I am speaking with the correct person using two identifiers.  Location: Patient: work Provider: office   I discussed the limitations of evaluation and management by telemedicine and the availability of in person appointments. The patient expressed understanding and agreed to proceed.  History of Present Illness:  Chief Complaint  Patient presents with  . Depression    VIRTUAL follow up on depresion/anxiety. Stopped taking the Cymbalta about 2.5-3 weeks into taking. ZYS0(63)    Patient presents for follow-up on depression, anxiety, weight loss and GI complaints.  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  She reported constantly second guessing herself--did she forget things, did she do things correctly--these things constantly run through her head, especially at night. She gets angry at herself if she forgets to do something. At that visit, PHQ-9 score was 18, GAD-7 score was 17. She was restarted on cymbalta--starting at 30mg , with plans to increase to 60mg .  She reports she stopped taking this about 2.5-3 weeks into taking it. Felt dizzy--vertigo, nauseated, felt "hung over", headache.  Stayed at the 30mg , never increased, never got better, so stopped it. (had previously taken medication and tolerated).  She did not let me know that she stopped it.  She tried scheduling for therapy through her insurance. She filled things out, never heard anything. We then put in for [facility] referral, but she reported they were playing phone tag.  She hasn't gotten an appointment with therapist yet.  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  This is supposed to happen this evening. During today's visit, he texted and postponed meeting until Tuesday.  She was very visibly relieved, slightly tearful.  Sleep has been worse over the past week. Having trouble staying asleep.  At her physical she also mentioned some GI complaints--said that stools had been looser for 4-6 weeks.  Not watery, no blood or mucus. She generally avoids dairy and gluten in her diet (feels worse after eating, limits), though she had been having some cinnamon toast recently. She has frequent "uneasiness" in her stomach, not pain.  Today she reports also having some bloating, like "food doesn't agree with her".  No changes to diet.  Started towards the end of being on the cymbalta (the last week of taking it), hasn't changed since then. No change in stools.  She had negative Cologuard last year.    Observations/Objective:  Ht 5' 8.5" (1.74 m)   LMP 06/08/2019 (Approximate)   BMI 19.42 kg/m   Weight was 123# at home on 3/28, so stopped getting on scale (scared her). Will weight tonight and send message. Doesn't feel like she has lost more Wt Readings from Last 3 Encounters:  10/06/20 129 lb 9.6 oz (58.8 kg)  11/19/19 145 lb 9.6 oz (66 kg)  04/23/12 140 lb (63.5 kg)   Well-appearing female. She is alert and oriented. Normal eye contact and speech. She appeared calm--became emotional/tearful due to some relief when meeting tonight was postponed (got text during visit).   PHQ-9  13 (down from 18) GAD-7 15 (was 17)   Assessment and Plan:  Generalized anxiety disorder - significant situational component. Start Buspar. RF Xanax to use prn--risksSE reviewed. If tolerating Buspar, will then add in Lexapro. Counseling rec - Plan: busPIRone (BUSPAR) 15 MG tablet, ALPRAZolam (XANAX) 0.25 MG tablet  Moderate recurrent major depression (HCC) - didn't tolerate cymbalta.  Encouraged counseling.  Will plan to try lexapro, but  anxiety is worse now--start with buspar (1 new med at a time)  Weight loss, unintentional - suspect related to stress/anxiety. R/o underlying cause (ovarian, GI), given also having bloating, bowel changes. CT  Abdominal bloating   CT abd/pelvis--at [facility] in [place] ([place]) or [place]. Counseling  Start Buspar first, likely will add lexapro in 1-2 weeks Start buspar 5mg  BID for a few days then increase to 7.5mg  BID. Can further increase by extra 1/2 tablet once a week on a weekly bsis up to 15mg  BID if needed.  Xanax 0.25 #15 Pharmacy [facility] [address] [place]   Follow Up Instructions:    I discussed the assessment and treatment plan with the patient. The patient was provided an opportunity to ask questions and all were answered. The patient agreed with the plan and demonstrated an understanding of the instructions.   The patient was advised to call back or seek an in-person evaluation if the symptoms worsen or if the condition fails to improve as anticipated.  I spent 45 minutes dedicated to the care of this patient, including pre-visit review of records, face to face time, post-visit ordering of testing and documentation.    , MD

## 2020-11-17 ENCOUNTER — Encounter: Payer: Self-pay | Admitting: Family Medicine

## 2020-11-17 ENCOUNTER — Telehealth (INDEPENDENT_AMBULATORY_CARE_PROVIDER_SITE_OTHER): Payer: BC Managed Care – PPO | Admitting: Family Medicine

## 2020-11-17 ENCOUNTER — Other Ambulatory Visit: Payer: Self-pay

## 2020-11-17 VITALS — Ht 68.5 in

## 2020-11-17 DIAGNOSIS — F331 Major depressive disorder, recurrent, moderate: Secondary | ICD-10-CM | POA: Diagnosis not present

## 2020-11-17 DIAGNOSIS — R634 Abnormal weight loss: Secondary | ICD-10-CM | POA: Diagnosis not present

## 2020-11-17 DIAGNOSIS — F411 Generalized anxiety disorder: Secondary | ICD-10-CM | POA: Diagnosis not present

## 2020-11-17 DIAGNOSIS — R140 Abdominal distension (gaseous): Secondary | ICD-10-CM

## 2020-11-17 MED ORDER — ALPRAZOLAM 0.25 MG PO TABS: 0.2500 mg | ORAL_TABLET | Freq: Three times a day (TID) | ORAL | 0 refills | Status: DC | PRN

## 2020-11-17 MED ORDER — BUSPIRONE HCL 15 MG PO TABS: ORAL_TABLET | ORAL | 0 refills | Status: DC

## 2020-11-17 NOTE — Patient Instructions (Signed)
Start the Buspar at 1/3 of a tablet twice daily (5mg  dose). If this doesn't seem to cause any side effects after a couple of days, increase to 1/2 tablet (7.5mg  dose), and take that twice daily. If you tolerate this, but if not adequately helping anxiety, then start taking full tablet in the morning, continue 1/2 tablet in the evening.   After 1 week, if still not improving (but tolerating okay), increase to the full pill twice daily.  Keep it there for 1-2 weeks, and let me know how you're doing.  I do think that we will need to start Lexapro--this will help with depression (which buspar doesn't), as well as with anxiety.  I don't want to start 2 medications at the same time, as we then won't know which is causing what side effects.  So start the Buspar first, and if/when anxiety is a little better, we can introduce lexapro in 1-2 weeks. Counseling is very important in getting a better grasp on this anxiety, helping you through this stressful time, and not letting your body bear the brunt of your stress.  You may use the xanax if needed for severe anxiety--be sure not to mix with alcohol, and use caution with driving, as it can make you sleepy.  You can use it sparingly at night (ie Monday night, if you can't sleep, worrying about Tuesday's meeting).  I don't want you using it regularly to help you sleep.  I'm very worried about the weight loss and abdominal bloating, bowel changes. It is possible that it is all from stress (irritable bowel), but I'd hate to miss something more concerning (bowel or ovary related).  I want to check a CT scan to rule out anything bad.  Don't forget to weigh yourself and tell me your current weight.  Tuesday will call over to [facility] and try and get the CT set up next week (I'm not in the office Monday, but she will be).  Sometimes it takes a while (to get authorization from insurance, etc).  Let me know if you don't hear from her by late Thursday.  Hang in there, and  keep me updated!

## 2020-11-18 ENCOUNTER — Encounter: Payer: Self-pay | Admitting: Family Medicine

## 2020-11-21 ENCOUNTER — Other Ambulatory Visit: Payer: Self-pay | Admitting: *Deleted

## 2020-11-21 DIAGNOSIS — R634 Abnormal weight loss: Secondary | ICD-10-CM

## 2020-11-21 DIAGNOSIS — R140 Abdominal distension (gaseous): Secondary | ICD-10-CM

## 2020-11-21 DIAGNOSIS — R194 Change in bowel habit: Secondary | ICD-10-CM

## 2020-11-24 ENCOUNTER — Encounter: Payer: Self-pay | Admitting: Family Medicine

## 2020-11-24 ENCOUNTER — Encounter: Payer: BC Managed Care – PPO | Admitting: Family Medicine

## 2020-11-24 DIAGNOSIS — Z20822 Contact with and (suspected) exposure to covid-19: Secondary | ICD-10-CM | POA: Diagnosis not present

## 2020-11-30 ENCOUNTER — Encounter: Payer: Self-pay | Admitting: Family Medicine

## 2020-11-30 MED ORDER — ONDANSETRON 4 MG PO TBDP: 4.0000 mg | ORAL_TABLET | Freq: Three times a day (TID) | ORAL | 0 refills | Status: DC | PRN

## 2020-12-05 ENCOUNTER — Encounter: Payer: Self-pay | Admitting: Family Medicine

## 2020-12-05 DIAGNOSIS — R55 Syncope and collapse: Secondary | ICD-10-CM | POA: Diagnosis not present

## 2020-12-05 NOTE — Telephone Encounter (Signed)
Patient advised.

## 2020-12-16 DIAGNOSIS — R194 Change in bowel habit: Secondary | ICD-10-CM | POA: Diagnosis not present

## 2020-12-16 DIAGNOSIS — R140 Abdominal distension (gaseous): Secondary | ICD-10-CM | POA: Diagnosis not present

## 2020-12-16 DIAGNOSIS — R634 Abnormal weight loss: Secondary | ICD-10-CM | POA: Diagnosis not present

## 2020-12-27 ENCOUNTER — Encounter: Payer: Self-pay | Admitting: Family Medicine

## 2020-12-29 ENCOUNTER — Other Ambulatory Visit: Payer: Self-pay | Admitting: Family Medicine

## 2020-12-29 DIAGNOSIS — E559 Vitamin D deficiency, unspecified: Secondary | ICD-10-CM

## 2021-08-14 ENCOUNTER — Telehealth: Payer: Self-pay | Admitting: Family Medicine

## 2021-08-14 NOTE — Telephone Encounter (Signed)
Courtney Reid spent the night with Courtney Reid after Courtney Reid was notified by another person that she was in bad shape, SI. She hadn't been eating, lost weight per [facility].  Apparently more work stress, salary to be cut.  911 and [facility] came 1/8 (when she brought pt back to her home in [place]).  Pt wouldn't speak to me, but I overheard the conversation with Courtney Reid on the phone--she was planning for work the next day.  She was angry, hopeless, but not suicidal--planning to get through another work week (doing food prep, laundry).  Courtney Reid is helping pt arrange for psych.  Courtney Reid had been in contact with her family to try and get them to help. Parents were golfing in [facility] and brother in [place] reported he would try and come. Spoke to [place] at length, tried to provide some info/counseling in how she could help/what to say.  Later communicated with her about MH resources as well.

## 2021-08-15 DIAGNOSIS — N951 Menopausal and female climacteric states: Secondary | ICD-10-CM | POA: Diagnosis not present

## 2021-08-15 DIAGNOSIS — Z0000 Encounter for general adult medical examination without abnormal findings: Secondary | ICD-10-CM | POA: Diagnosis not present

## 2021-08-15 DIAGNOSIS — Z13220 Encounter for screening for lipoid disorders: Secondary | ICD-10-CM | POA: Diagnosis not present

## 2021-08-15 DIAGNOSIS — F418 Other specified anxiety disorders: Secondary | ICD-10-CM | POA: Diagnosis not present

## 2021-10-25 ENCOUNTER — Encounter: Payer: BC Managed Care – PPO | Admitting: Family Medicine

## 2021-12-06 NOTE — Patient Instructions (Addendum)
?HEALTH MAINTENANCE RECOMMENDATIONS: ? ?It is recommended that you get at least 30 minutes of aerobic exercise at least 5 days/week (for weight loss, you may need as much as 60-90 minutes). This can be any activity that gets your heart rate up. This can be divided in 10-15 minute intervals if needed, but try and build up your endurance at least once a week.  Weight bearing exercise is also recommended twice weekly. ? ?Eat a healthy diet with lots of vegetables, fruits and fiber.  "Colorful" foods have a lot of vitamins (ie green vegetables, tomatoes, red peppers, etc).  Limit sweet tea, regular sodas and alcoholic beverages, all of which has a lot of calories and sugar.  Up to 1 alcoholic drink daily may be beneficial for women (unless trying to lose weight, watch sugars).  Drink a lot of water. ? ?Calcium recommendations are 1200-1500 mg daily (1500 mg for postmenopausal women or women without ovaries), and vitamin D 1000 IU daily.  This should be obtained from diet and/or supplements (vitamins), and calcium should not be taken all at once, but in divided doses. ? ?Monthly self breast exams and yearly mammograms for women over the age of 43 is recommended. ? ?Sunscreen of at least SPF 30 should be used on all sun-exposed parts of the skin when outside between the hours of 10 am and 4 pm (not just when at beach or pool, but even with exercise, golf, tennis, and yard work!)  Use a sunscreen that says "broad spectrum" so it covers both UVA and UVB rays, and make sure to reapply every 1-2 hours. ? ?Remember to change the batteries in your smoke detectors when changing your clock times in the spring and fall. Carbon monoxide detectors are recommended for your home. ? ?Use your seat belt every time you are in a car, and please drive safely and not be distracted with cell phones and texting while driving. ? ?I recommend getting the new shingles vaccine (Shingrix). You may want to check with your insurance to verify what  your out of pocket cost may be (usually covered as preventative, but better to verify to avoid any surprises, as this vaccine is expensive), and then schedule a nurse visit at our office when convenient (based on the possible side effects as discussed).   ?This is a series of 2 injections, spaced 2 months apart.  It doesn't have to be exactly 2 months apart (but can't be sooner), if that isn't feasible for your schedule, but try and get them close to 2 months (and definitely within 6 months of each other, or else the efficacy of the vaccine drops off). ?This should be separated from other vaccines by at least 2 weeks. ? ?Bivalent COVID booster is recommended. Yearly flu shots are recommended. ? ?Try and find a new dentist for regular cleanings. ?I encourage you to get a routine eye exam. ? ?Consider looking into TMS therapy for depression (transcranial magnetic)--[facility] is on [address], there may be others in [place]. ? ?I do think seeing a psychiatrist to discuss medication options would be better than trying all that you're doing on your own. ?Genetic testing is something to consider if you aren't responding to medications. ?Pristiq is a potential medication to try ? ?You may need to look into FMLA in order to get the treatment you need. ?You need to come first. ? ?Hormonal therapy may help you a lot (with sleep and quality of life). ?This is something to consider (estradiol  patch and prometrium pill--bio-identical FDA approved hormones). ?This can only be considered once we make sure that your breasts are all okay. ?We are going to arrange for mammogram and ultrasound (due to the finding of the lymph nodes in the right axilla. ? ?

## 2021-12-06 NOTE — Progress Notes (Signed)
?Chief Complaint  ?Patient presents with  ? Annual Exam  ?  Fasting annual exam with pelvic. No new concerns, still anxiety, derpresssion, menopause and would like her hormones checked. Tried to do eye exam but she said she would not be able to see anyway.   ? ? ?Courtney Reid is a 51 y.o. female who presents for a complete physical. ? ?Last year she had significant anxiety related to work stress, had been having some looser stools, frequent "uneasiness" in her stomach and some weight loss. She had negative abdominal and pelvic US, as well as normal labs (celiac panel, CBC, TSH, chem). ? ?She was restarted on Cymbalta (also to possibly help with hot flashes). She only took 30mg  for 2.5-3 weeks, stopped due to side effects.  Felt dizzy--vertigo, nauseated, felt "hung over", headache.  At her f/u 6 weeks later,  she was started on Buspar. Plan was to start with Buspar (since anxiety was severe), with plans to try lexapro (starting sequentially, due to potential for SE).  She never returned for follow-up.  She was encouraged to get counseling.  H/o panic attacks in the past (2009). ? ?Shortly after she got COVID--rough course with severe fatigue, and even syncope. ?Never followed up here. ? ?Today she states she recalls getting up to 15mg  BID of Buspar, didn't notice a difference. ?Doesn't like pills, doesn't remember to take them, doesn't want to take pills. ?Reached another stressful point in January, related to work stress, which persists. ?She has been talking to a therapist , virtually every 2-3 weeks).  She had been told she can't schedule therapy visit during business hours, which has limited how often she can have visits. She states that the therapist said she needs more help than she can provide, and recommended PHP or IOP [facility] [employer] [facility] but she can't do that with her job. ?She states she is doing everything she can on her own (various supplements, changes in diet, cutting back on sugar  because that worsens night sweats which affects her sleep, etc). ? ?She has typed a letter of resignation, not ready to turn it in, waiting to see what happens (if they will let her go, or change contract when up for renewal in July, which she then doesn't have to sign). She states she is "waiting for the decision to be made for her." ?She reports feeling sadness, frustration, angst--work-related, but not all (family issues also). ?She has been talking to a coach, and realizes she has options (could do massage therapy during the noncompete time). ? ?She continues to have postmenopausal symptoms.  Last cycle was 10/2018. ?She has hot flashes and night sweats. Wakes her up, hard to get back to sleep. ?Black cohosh doesn't help. Evening primrose didn't help. Estroven didn't help. ?Her therapist suggested getting hormones checked. ? ?Vitamin D deficiency:  level was low at 17.7 in 11/2020. She had normal level previously, when she was having non-dairy milk (she no longer uses this). She was prescribed 12 weeks of rx D, and was advised to start an OTC supplement of 08-1998 IU daily upon completion of Rx. ?She has been taking a supplement of 1 drop/day of D3-K2 (unsure of dose). ? ? ?Dyslipidemia:  Previously TG elevated, HDL mildly low, elevated chol/HDL ratio. This was improved on last check. ?Reported being a "gummy bear whore"--a few daily, not large portions. Recently started limiting sugar even more (worsens hot flashes). ?Has some ginger chews after lunch.  ? ?Lab Results  ?Component  Value Date  ? CHOL 172 10/06/2020  ? HDL 52 10/06/2020  ? LDLCALC 106 (H) 10/06/2020  ? TRIG 71 10/06/2020  ? CHOLHDL 3.3 10/06/2020  ? ? ?Immunization History  ?Administered Date(s) Administered  ? PFIZER(Purple Top)SARS-COV-2 Vaccination 10/15/2019, 11/05/2019  ? Td 08/06/2001  ? Tdap 11/06/2010, 12/07/2021  ? 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 ?Ophtho: not in a long time.  "I'm blind as a bat".  Uses readers. ?Exercise: CrossFit daily, plus yoga once or twice/week. ?Has gym at work, works out daily. ?Running 1 mile/day ? ?Eats a lot of leafy greens.  ?No longer using non-dairy milk in her smoothies ? ? ?PMH, PSH, SH and FH were reviewed and updated ? ?Outpatient Encounter Medications as of 12/07/2021  ?Medication Sig Note  ? Ergocalciferol (VITAMIN D2 PO) Take 1 drop by mouth daily. 12/07/2021: Plus K2-unsure of strength  ? ibuprofen (ADVIL) 200 MG tablet Take 200 mg by mouth every 6 (six) hours as needed. 12/07/2021: As needed.  ? Magnesium 250 MG TABS Take 750 mg by mouth at bedtime.   ? NON FORMULARY Take 1 Scoop by mouth daily. 12/07/2021: 1 Scoop of powder daily  ? [DISCONTINUED] ALPRAZolam (XANAX) 0.25 MG tablet Take 1 tablet (0.25 mg total) by mouth 3 (three) times daily as needed for anxiety.   ? [DISCONTINUED] busPIRone (BUSPAR) 15 MG tablet Start 1/3 tablet twice daily, and increase to 1/2 tablet twice daily after a few days. Increase by 1/2 tablet/d weekly, if needed until 1 tab twice daily   ? [DISCONTINUED] DULoxetine (CYMBALTA) 30 MG capsule Take 1 capsule by mouth daily.  Increase to 2 capsules after a week if tolerated (Patient not taking: Reported on 11/17/2020)   ? [DISCONTINUED] DULoxetine (CYMBALTA) 60 MG capsule Take 1 capsule (60 mg total) by mouth daily. (Patient not taking: Reported on 11/17/2020)   ? [DISCONTINUED] ondansetron (ZOFRAN ODT) 4 MG disintegrating tablet Take 1 tablet (4 mg total) by mouth every 8 (eight) hours as needed for nausea or vomiting.   ? [DISCONTINUED] Vitamin D, Ergocalciferol, (DRISDOL) 1.25 MG (50000 UNIT) CAPS capsule Take 1 capsule (50,000 Units total) by mouth every 7 (seven) days.   ? ?No facility-administered encounter  medications on file as of 12/07/2021.  ? ?Taking supplements--pre/pro-biotics and Magnesium ? ?No Known Allergies ? ? ?ROS:  The patient denies anorexia, fever, headaches, decreased hearing, ear pain, sore throat, breast concerns, chest pain, palpitations, dizziness, syncope, dyspnea on exertion, cough, swelling, constipation, melena, hematochezia, hematuria, incontinence, dysuria, vaginal bleeding, discharge, odor or itch, genital lesions, numbness, tingling, weakness, tremor, suspicious skin lesions, abnormal bleeding/bruising, or enlarged lymph nodes. ? ?+hot flashes and night sweats ?+depression/anxiety per HPI ?Trouble sleeping per HPI, mainly related to hot flashes and some stress. ?Poor vision--declined to have visual acuity checked today. ?She reports chronic nausea, vomits frequently, once or twice daily, less on weekends. Thinks related to stress. ?Bowels remains loose.  She thinks the magnesium she is taking to help her sleep may contribute. ?She got down to 119# in January (severe anxiety/depression at that time, not eating). ? ? ? ?PHYSICAL EXAM: ? ?BP 110/76   Pulse (!) 52   Ht 5' 8.5" (1.74 m)   Wt 128 lb 9.6 oz (58.3 kg)  LMP 06/08/2019 (Approximate)   BMI 19.27 kg/m?  ? ?Wt Readings from Last 3 Encounters:  ?12/07/21 128 lb 9.6 oz (58.3 kg)  ?10/06/20 129 lb 9.6 oz (58.8 kg)  ?11/19/19 145 lb 9.6 oz (66 kg)  ? ? ?General Appearance:    Alert, cooperative, no distress, appears stated age  ?Head:    Normocephalic, without obvious abnormality, atraumatic  ?Eyes:    PERRL, conjunctiva/corneas clear, EOM's intact, fundi  ?  benign  ?Ears:    Normal TM's and external ear canals  ?Nose:   Normal, no drainage  ?Throat:   Normal mucosa, no lesions  ?Neck:   Supple, no lymphadenopathy;  thyroid:  no enlargement/ tenderness/nodules; no carotid bruit or JVD  ?Back:    Spine nontender, no curvature, ROM normal, no CVA     tenderness  ?Lungs:     Clear to auscultation bilaterally without wheezes, rales or      ronchi; respirations unlabored  ?Chest Wall:    No tenderness or deformity  ? Heart:    Regular rate and rhythm, S1 and S2 normal, no murmur, rub ?  or gallop  ?Breast Exam:    No tenderness, masses, or nipple disc

## 2021-12-07 ENCOUNTER — Encounter: Payer: Self-pay | Admitting: Family Medicine

## 2021-12-07 ENCOUNTER — Ambulatory Visit (INDEPENDENT_AMBULATORY_CARE_PROVIDER_SITE_OTHER): Payer: BC Managed Care – PPO | Admitting: Family Medicine

## 2021-12-07 ENCOUNTER — Encounter: Payer: Self-pay | Admitting: *Deleted

## 2021-12-07 VITALS — BP 110/76 | HR 52 | Ht 68.5 in | Wt 128.6 lb

## 2021-12-07 DIAGNOSIS — E559 Vitamin D deficiency, unspecified: Secondary | ICD-10-CM

## 2021-12-07 DIAGNOSIS — Z0000 Encounter for general adult medical examination without abnormal findings: Secondary | ICD-10-CM

## 2021-12-07 DIAGNOSIS — F339 Major depressive disorder, recurrent, unspecified: Secondary | ICD-10-CM | POA: Diagnosis not present

## 2021-12-07 DIAGNOSIS — N959 Unspecified menopausal and perimenopausal disorder: Secondary | ICD-10-CM

## 2021-12-07 DIAGNOSIS — Z23 Encounter for immunization: Secondary | ICD-10-CM

## 2021-12-07 DIAGNOSIS — Z1159 Encounter for screening for other viral diseases: Secondary | ICD-10-CM | POA: Diagnosis not present

## 2021-12-07 DIAGNOSIS — R590 Localized enlarged lymph nodes: Secondary | ICD-10-CM | POA: Diagnosis not present

## 2021-12-07 DIAGNOSIS — F411 Generalized anxiety disorder: Secondary | ICD-10-CM | POA: Diagnosis not present

## 2021-12-07 DIAGNOSIS — R112 Nausea with vomiting, unspecified: Secondary | ICD-10-CM

## 2021-12-07 LAB — POCT URINALYSIS DIP (PROADVANTAGE DEVICE)
Bilirubin, UA: NEGATIVE
Blood, UA: NEGATIVE
Glucose, UA: NEGATIVE mg/dL
Ketones, POC UA: NEGATIVE mg/dL
Leukocytes, UA: NEGATIVE
Nitrite, UA: NEGATIVE
Protein Ur, POC: NEGATIVE mg/dL
Specific Gravity, Urine: 1.01
Urobilinogen, Ur: NEGATIVE
pH, UA: 7.5 (ref 5.0–8.0)

## 2021-12-08 LAB — CBC WITH DIFFERENTIAL/PLATELET
Basophils Absolute: 0 10*3/uL (ref 0.0–0.2)
Basos: 0 %
EOS (ABSOLUTE): 0.1 10*3/uL (ref 0.0–0.4)
Eos: 1 %
Hematocrit: 41.6 % (ref 34.0–46.6)
Hemoglobin: 14.3 g/dL (ref 11.1–15.9)
Immature Grans (Abs): 0 10*3/uL (ref 0.0–0.1)
Immature Granulocytes: 0 %
Lymphocytes Absolute: 2.8 10*3/uL (ref 0.7–3.1)
Lymphs: 33 %
MCH: 32.5 pg (ref 26.6–33.0)
MCHC: 34.4 g/dL (ref 31.5–35.7)
MCV: 95 fL (ref 79–97)
Monocytes Absolute: 0.7 10*3/uL (ref 0.1–0.9)
Monocytes: 8 %
Neutrophils Absolute: 4.9 10*3/uL (ref 1.4–7.0)
Neutrophils: 58 %
Platelets: 277 10*3/uL (ref 150–450)
RBC: 4.4 x10E6/uL (ref 3.77–5.28)
RDW: 12.8 % (ref 11.7–15.4)
WBC: 8.5 10*3/uL (ref 3.4–10.8)

## 2021-12-08 LAB — COMPREHENSIVE METABOLIC PANEL
ALT: 18 IU/L (ref 0–32)
AST: 21 IU/L (ref 0–40)
Albumin/Globulin Ratio: 2.3 — ABNORMAL HIGH (ref 1.2–2.2)
Albumin: 5.1 g/dL — ABNORMAL HIGH (ref 3.8–4.9)
Alkaline Phosphatase: 75 IU/L (ref 44–121)
BUN/Creatinine Ratio: 12 (ref 9–23)
BUN: 11 mg/dL (ref 6–24)
Bilirubin Total: 0.5 mg/dL (ref 0.0–1.2)
CO2: 26 mmol/L (ref 20–29)
Calcium: 9.9 mg/dL (ref 8.7–10.2)
Chloride: 102 mmol/L (ref 96–106)
Creatinine, Ser: 0.95 mg/dL (ref 0.57–1.00)
Globulin, Total: 2.2 g/dL (ref 1.5–4.5)
Glucose: 93 mg/dL (ref 70–99)
Potassium: 4.7 mmol/L (ref 3.5–5.2)
Sodium: 141 mmol/L (ref 134–144)
Total Protein: 7.3 g/dL (ref 6.0–8.5)
eGFR: 72 mL/min/{1.73_m2} (ref >59)

## 2021-12-08 LAB — HEPATITIS C ANTIBODY: Hep C Virus Ab: NONREACTIVE

## 2021-12-08 LAB — VITAMIN D 25 HYDROXY (VIT D DEFICIENCY, FRACTURES): Vit D, 25-Hydroxy: 22.4 ng/mL — ABNORMAL LOW (ref 30.0–100.0)

## 2022-01-04 ENCOUNTER — Encounter: Payer: Self-pay | Admitting: Family Medicine

## 2022-02-07 DIAGNOSIS — Z1231 Encounter for screening mammogram for malignant neoplasm of breast: Secondary | ICD-10-CM | POA: Diagnosis not present

## 2022-02-07 LAB — HM MAMMOGRAPHY

## 2022-02-23 ENCOUNTER — Encounter: Payer: Self-pay | Admitting: *Deleted

## 2022-02-23 NOTE — Progress Notes (Signed)
[facility] Quality Team Note  Name: Courtney Reid Date of Birth: 07/08/1971 MRN: [id number] Date: 02/23/2022  [facility] Quality Team has reviewed this patient's chart, please see recommendations below:  Mammogram Scheduling; Patient requests provider office schedule mammogram screening at (patient preference). [facility] Quality Other; (Following up on 01/04/2022 patient message to your office.)

## 2022-02-23 NOTE — Progress Notes (Deleted)
[facility] Quality Team Note  Name: Courtney Reid Date of Birth: October 26, 1970 MRN: [id number] Date: 02/23/2022  [facility] Quality Team has reviewed this patient's chart, please see recommendations below:  Mammogram Scheduling; {Mammogram Scheduling:27563} [facility] Quality Other; {[facility] Quality Other:27571}

## 2022-03-07 ENCOUNTER — Encounter: Payer: Self-pay | Admitting: *Deleted

## 2022-03-19 NOTE — Progress Notes (Signed)
Already taken care of. Called patient and she already had done-got copy and abstracted.

## 2022-04-11 ENCOUNTER — Encounter: Payer: Self-pay | Admitting: Internal Medicine

## 2022-04-18 ENCOUNTER — Encounter: Payer: Self-pay | Admitting: Family Medicine

## 2022-04-30 DIAGNOSIS — N950 Postmenopausal bleeding: Secondary | ICD-10-CM | POA: Diagnosis not present

## 2022-04-30 DIAGNOSIS — N83292 Other ovarian cyst, left side: Secondary | ICD-10-CM | POA: Diagnosis not present

## 2022-05-01 ENCOUNTER — Encounter: Payer: Self-pay | Admitting: Family Medicine

## 2022-05-15 ENCOUNTER — Encounter: Payer: Self-pay | Admitting: Internal Medicine

## 2022-12-17 ENCOUNTER — Encounter: Payer: BC Managed Care – PPO | Admitting: Family Medicine
